# Patient Record
Sex: Male | Born: 1955
Health system: Southern US, Community
[De-identification: ages and names within clinical notes are randomized; demographics above are authoritative.]

## PROBLEM LIST (undated history)

## (undated) DIAGNOSIS — J449 Chronic obstructive pulmonary disease, unspecified: Secondary | ICD-10-CM

## (undated) DIAGNOSIS — J45909 Unspecified asthma, uncomplicated: Secondary | ICD-10-CM

## (undated) DIAGNOSIS — M109 Gout, unspecified: Secondary | ICD-10-CM

## (undated) HISTORY — DX: Gout, unspecified: M10.9

## (undated) HISTORY — DX: Unspecified asthma, uncomplicated: J45.909

## (undated) HISTORY — DX: Chronic obstructive pulmonary disease, unspecified: J44.9

---

## 2010-12-05 ENCOUNTER — Encounter (HOSPITAL_COMMUNITY)
Admission: RE | Admit: 2010-12-05 | Discharge: 2010-12-05 | Disposition: A | Discharge status: Home / Self Care | Payer: BC Managed Care – PPO | Source: Ambulatory Visit | Attending: Orthopaedic Surgery | Admitting: Orthopaedic Surgery

## 2010-12-05 ENCOUNTER — Ambulatory Visit (HOSPITAL_COMMUNITY)
Admission: RE | Admit: 2010-12-05 | Discharge: 2010-12-05 | Disposition: A | Discharge status: Home / Self Care | Payer: BC Managed Care – PPO | Source: Ambulatory Visit | Attending: Orthopaedic Surgery | Admitting: Orthopaedic Surgery

## 2010-12-05 ENCOUNTER — Other Ambulatory Visit (HOSPITAL_COMMUNITY): Payer: Self-pay | Admitting: Orthopaedic Surgery

## 2010-12-05 DIAGNOSIS — M1711 Unilateral primary osteoarthritis, right knee: Secondary | ICD-10-CM

## 2010-12-05 DIAGNOSIS — M1710 Unilateral primary osteoarthritis, unspecified knee: Secondary | ICD-10-CM | POA: Insufficient documentation

## 2010-12-05 DIAGNOSIS — Z01812 Encounter for preprocedural laboratory examination: Secondary | ICD-10-CM | POA: Insufficient documentation

## 2010-12-05 DIAGNOSIS — J438 Other emphysema: Secondary | ICD-10-CM | POA: Insufficient documentation

## 2010-12-05 DIAGNOSIS — IMO0002 Reserved for concepts with insufficient information to code with codable children: Secondary | ICD-10-CM | POA: Insufficient documentation

## 2010-12-05 DIAGNOSIS — Z01810 Encounter for preprocedural cardiovascular examination: Secondary | ICD-10-CM | POA: Insufficient documentation

## 2010-12-05 DIAGNOSIS — Z01811 Encounter for preprocedural respiratory examination: Secondary | ICD-10-CM | POA: Insufficient documentation

## 2010-12-05 LAB — BASIC METABOLIC PANEL
BUN: 16 mg/dL (ref 6–23)
GFR calc Af Amer: >60 mL/min (ref >60)
GFR calc non Af Amer: >60 mL/min (ref >60)
Potassium: 5.6 mEq/L — ABNORMAL HIGH (ref 3.5–5.1)

## 2010-12-05 LAB — CBC
HCT: 42.7 % (ref 39.0–52.0)
MCHC: 34.9 g/dL (ref 30.0–36.0)
RDW: 13 % (ref 11.5–15.5)

## 2010-12-05 LAB — TYPE AND SCREEN: ABO/RH(D): O NEG

## 2010-12-05 LAB — ABO/RH: ABO/RH(D): O NEG

## 2010-12-11 ENCOUNTER — Inpatient Hospital Stay (HOSPITAL_COMMUNITY)
Admission: RE | Admit: 2010-12-11 | Discharge: 2010-12-14 | DRG: 209 | Disposition: A | Discharge status: Home Health | Payer: BC Managed Care – PPO | Source: Ambulatory Visit | Attending: Orthopaedic Surgery | Admitting: Orthopaedic Surgery

## 2010-12-11 DIAGNOSIS — I10 Essential (primary) hypertension: Secondary | ICD-10-CM | POA: Diagnosis present

## 2010-12-11 DIAGNOSIS — J4489 Other specified chronic obstructive pulmonary disease: Secondary | ICD-10-CM | POA: Diagnosis present

## 2010-12-11 DIAGNOSIS — J449 Chronic obstructive pulmonary disease, unspecified: Secondary | ICD-10-CM | POA: Diagnosis present

## 2010-12-11 DIAGNOSIS — M1710 Unilateral primary osteoarthritis, unspecified knee: Principal | ICD-10-CM | POA: Diagnosis present

## 2010-12-11 DIAGNOSIS — F17200 Nicotine dependence, unspecified, uncomplicated: Secondary | ICD-10-CM | POA: Diagnosis present

## 2010-12-11 HISTORY — PX: REPLACEMENT TOTAL KNEE: SUR1224

## 2010-12-12 LAB — CBC
MCH: 31.5 pg (ref 26.0–34.0)
MCHC: 34.4 g/dL (ref 30.0–36.0)
MCV: 91.5 fL (ref 78.0–100.0)
Platelets: 192 10*3/uL (ref 150–400)
RDW: 13.4 % (ref 11.5–15.5)
WBC: 9.3 10*3/uL (ref 4.0–10.5)

## 2010-12-12 LAB — BASIC METABOLIC PANEL
BUN: 12 mg/dL (ref 6–23)
Calcium: 8.3 mg/dL — ABNORMAL LOW (ref 8.4–10.5)
Creatinine, Ser: 0.82 mg/dL (ref 0.50–1.35)
GFR calc non Af Amer: >60 mL/min (ref >60)
Glucose, Bld: 115 mg/dL — ABNORMAL HIGH (ref 70–99)

## 2010-12-13 LAB — CBC
MCH: 31.6 pg (ref 26.0–34.0)
MCHC: 34.5 g/dL (ref 30.0–36.0)
MCV: 91.6 fL (ref 78.0–100.0)
Platelets: 175 10*3/uL (ref 150–400)

## 2010-12-13 LAB — PROTIME-INR: Prothrombin Time: 17.5 seconds — ABNORMAL HIGH (ref 11.6–15.2)

## 2010-12-13 LAB — BASIC METABOLIC PANEL
BUN: 10 mg/dL (ref 6–23)
Creatinine, Ser: 0.8 mg/dL (ref 0.50–1.35)
GFR calc Af Amer: >60 mL/min (ref >60)
GFR calc non Af Amer: >60 mL/min (ref >60)
Glucose, Bld: 103 mg/dL — ABNORMAL HIGH (ref 70–99)

## 2010-12-14 LAB — CBC
HCT: 27.3 % — ABNORMAL LOW (ref 39.0–52.0)
MCH: 31.4 pg (ref 26.0–34.0)
MCHC: 34.8 g/dL (ref 30.0–36.0)
RDW: 13.3 % (ref 11.5–15.5)

## 2010-12-14 LAB — BASIC METABOLIC PANEL
BUN: 7 mg/dL (ref 6–23)
CO2: 30 mEq/L (ref 19–32)
Calcium: 8.8 mg/dL (ref 8.4–10.5)
Creatinine, Ser: 0.83 mg/dL (ref 0.50–1.35)

## 2010-12-14 LAB — PROTIME-INR
INR: 1.91 — ABNORMAL HIGH (ref 0.00–1.49)
Prothrombin Time: 22.2 seconds — ABNORMAL HIGH (ref 11.6–15.2)

## 2011-01-02 NOTE — Op Note (Signed)
Anthony Mayo, [name]                ACCOUNT NO.:  [id number]  MEDICAL RECORD NO.:  [id number]  LOCATION:  5024                         FACILITY:  [facility]  PHYSICIAN:  Lubertha Basque. Savi Lastinger, M.D.DATE OF BIRTH:  [birth year]  DATE OF PROCEDURE:  12/11/2010 DATE OF DISCHARGE:                              OPERATIVE REPORT   PREOPERATIVE DIAGNOSIS:  Right knee degenerative joint disease.  POSTOPERATIVE DIAGNOSIS:  Right knee degenerative joint disease.  PROCEDURE:  Right total knee replacement.  ANESTHESIA:  General and block.  ATTENDING SURGEON:  Lubertha Basque. Jerl Santos, MD  ASSISTANT:  Lindwood Qua, PA   INDICATIONS FOR PROCEDURE:  The patient is a 55 year old male with a long history of right knee issues.  He has had 30 years of pain with a history of arthroscopy and injections and pills.  By x-ray he has advanced degenerative change and large osteophytes and loose bodies.  He has pain, which limits his ability to rest and walk and exercise, and he is offered a knee replacement.  Informed operative consent was obtained after discussion of possible complications including reaction to anesthesia, infection, DVT, PE, and death.  Importance of the postoperative rehabilitation protocol to optimize result was stressed extensively with the patient.  SUMMARY FINDINGS AND PROCEDURE:  Under general anesthesia and block, a right knee replacement was performed.  He had advanced degenerative change and loose bodies.  He had good bone quality.  We addressed his problem with a cemented DePuy system using a 5 tibia, large femur, 10 deep dish spacer, and 41 all polyethylene patella.  I did include Zinacef antibiotic in the cement.  Lindwood Qua assisted throughout and was invaluable to the completion of the case in that he helped position and retract while I performed the procedure.  He also closed simultaneously to help minimize OR time.  He was admitted for appropriate postop  care.  DESCRIPTION OF THE PROCEDURE:  The patient was brought to the operating suite where general anesthetic was applied without difficulty.  He was also given a block in the preanesthesia area.  He was positioned supine and prepped and draped in normal sterile fashion.  After administration of IV Kefzol, the right leg was elevated, exsanguinated, and a tourniquet inflated about the thigh.  A longitudinal anterior incision was made with dissection down the extensor mechanism.  All appropriate anti-infective measures were used including closed hooded exhaust systems for each member of the surgical team, Betadine-impregnated drape, and the preoperative IV antibiotic.  A medial parapatellar incision was made.  The kneecap was flipped and knee flexed.  Some residual meniscal tissues were removed along with the ACL and PCL and fat pad.  There really was not much of an ACL but he did have a PCL remnant which I removed.  We removed a large osteophytes and extremely large loose body from the suprapatellar pouch which measured about 4 x 3 x 1 cm in size.  I then used an extramedullary guide on the tibia to make a roughly flat cut.  We then placed an intramedullary guide in the femur to make anterior-posterior cuts, creating a flexion gap of 10 mm. A second intramedullary guide was placed in the  femur to make a distal cut, creating an equal extension gap of 10 mm balancing the knee.  This did require one re-cut to balance appropriately.  The femur sized to a large and the tibia to a 5 and appropriate guides were placed and utilized.  Patella was cut down thickness by about 14 mm to 15 mm in diameter and was then sized to a 41 with appropriate guide placed and utilized.  A trial reduction was done with all these components and the 10 spacer.  He easily came to full extension.  The patella tracked well and flexion.  Trial components were removed followed by pulsatile lavage irrigation of all 3 cut  bony surfaces.  Cement was mixed including Zinacef antibiotic and was pressurized onto the bones.  This was followed by placement of the aforementioned DePuy LCS components. Pressure was held on the components until the cement had hardened and an excess cement was trimmed.  The tourniquet was deflated and a small amount of bleeding was easily controlled with Bovie cautery.  Again, the knee ranged fully.  We irrigated again and placed a drain, exiting superolaterally.  The extensor mechanism was then reapproximated with #1 Vicryl in interrupted fashion.  Subcutaneous tissues were reapproximated with 0 and 2-0 undyed Vicryl and skin was closed with staples.  Adaptic was applied followed by dry gauze and loose Ace wrap.  Estimated blood loss and fluids obtained from anesthesia records as can accurate tourniquet time.  DISPOSITION:  The patient was extubated in the operating room and taken to recovery room in stable addition.  He was to be admitted to the Orthopedic Surgery Service for appropriate postop care to include perioperative antibiotics and Coumadin plus Lovenox for DVT prophylaxis.     Lubertha Basque Jerl Santos, M.D.     PGD/MEDQ  D:  12/11/2010  T:  12/11/2010  Job:  [id number]  Electronically Signed by Marcene Corning M.D. on 01/02/2011 08:40:05 PM

## 2011-01-02 NOTE — Discharge Summary (Signed)
Anthony Mayo, Anthony Mayo                ACCOUNT NO.:  [id number]  MEDICAL RECORD NO.:  [id number]  LOCATION:  5024                         FACILITY:  [facility]  PHYSICIAN:  Lubertha Basque. Sherissa Tenenbaum, M.D.DATE OF BIRTH:  [birth year]  DATE OF ADMISSION:  12/11/2010 DATE OF DISCHARGE:  12/14/2010                              DISCHARGE SUMMARY   ADMITTING DIAGNOSES: 1. Right knee end-stage degenerative joint disease. 2. Hypertension. 3. Tobacco abuse. 4. History of gout.  DISCHARGE DIAGNOSES: 1. Right knee end-stage degenerative joint disease. 2. Hypertension. 3. Tobacco abuse. 4. History of gout.  OPERATION:  Right total knee replacement.  BRIEF HISTORY:  Anthony Mayo is a 55 year old white male who is complaining for the last number of months of increased right knee pain. He is having pain with every step, trouble sleeping at nighttime, trouble being comfortable walking, pain and swelling in the right knee, unrelieved by oral anti-inflammatory medicines or corticosteroid injections, he denies.  X-rays revealed bone-on-bone DJD.  We have discussed treatment options with him that being total knee replacement.  PERTINENT LABORATORY AND X-RAY FINDINGS:  CBC last testing available at the time of dictation, hemoglobin 10.1, hematocrit 29.3, and WBCs 10.9. Chem-7:  Sodium 141, potassium 3.9, BUN 10, creatinine 0.80, and glucose 103.  INR 1.41.  HOSPITAL COURSE:  He was admitted postoperatively, provided p.o. and IV analgesics for pain.  We used a Dilaudid PCA pump as well as p.o. Percocet.  He was given perioperative antibiotics, Lovenox and Coumadin per DVT prophylaxis regulated by pharmacy, duration of Coumadin would be 2 weeks, knee-high TED, incentive spirometry, physical therapy for weightbearing as tolerated and then a CPM machine 4-6 hours a day advancing as tolerated.  Knee immobilizer p.r.n. The first day postop, his vital signs were 148/81 blood pressure, heart rate of 110,  and temperature 99.  Lungs were clear.  Abdomen was soft.  Foley catheter had been discontinued and he was voiding well.  Right knee exam, no significant drainage.  There was a drain and that will be left in until second day postop.  Calf, soft and nontender.  Normal neurovascular status.  On second day postop, his vital signs remained stable.  His drain was pulled.  His dressing was changed.  Wound was noted to be benign.  Lungs were clear.  Abdomen was soft as well.  We had planned for home physical therapy due to the cause of probably not using a CPM machine and then home blood draws for INRs with a target INR between 2.0 and 3.0 per total of 14 days.  He continued to progress well and was discharged home on the third day.  CONDITION ON DISCHARGE:  Improved.  FOLLOWUP:  He will remain on a low-sodium heart-healthy diet.  He may be weightbearing as tolerated.  He may change his dressing daily.  He had arrangements for home therapy and home INRs to be done.  Any sign of infection which would be increasing redness, drainage, increasing pain to call our office at [phone].  Also that same number to set up an appointment for 2 weeks postoperatively.  He is given 3 prescriptions above and beyond his usual medicines that can be found  in the med discharge management sheet.  They are Coumadin 5 mg tablet to be regulated by the pharmacist for a total of 14 days, Robaxin 500 one p.o. q.8 p.r.n. spasm, and then Percocet 5/325 for pain as needed one to two q.4-6.  Change his dressing daily, ice, and elevation.     Anthony Mayo, P.A.   ______________________________ Lubertha Basque. Jerl Santos, M.D.    MC/MEDQ  D:  12/13/2010  T:  12/14/2010  Job:  [id number]  Electronically Signed by Anthony Mayo P.A. on 12/18/2010 02:26:58 PM Electronically Signed by Marcene Corning M.D. on 01/02/2011 08:38:07 PM

## 2011-01-02 NOTE — H&P (Addendum)
Anthony Mayo, Anthony Mayo                ACCOUNT NO.:  [id number]  MEDICAL RECORD NO.:  [id number]  LOCATION:  XRAY                         FACILITY:  [facility]  PHYSICIAN:  Lubertha Basque. Beatriz Settles, M.D.DATE OF BIRTH:  11/11/1955  DATE OF ADMISSION:  12/11/2010 DATE OF DISCHARGE:  12/14/2010                             HISTORY & PHYSICAL   CHIEF COMPLAINT:  Right knee pain.  HISTORY OF PRESENT ILLNESS:  Anthony Mayo is a 55 year old, does machine and body work mostly standing and has had pain with his right knee.  He had an arthroscopic operation some 30 years ago for torn meniscus and had done well initially but lately he has been having increasing discomfort, pain with standing, pain with trying to rest at nighttime. His x-rays reveal bone-on-bone DJD and has also recently been diagnosed with gout not just in the knee but at the elbow and other locations.  We discussed treatment options with him that being total knee replacement on the right side.  PAST MEDICAL HISTORY:  He is not a diabetic and has no drug allergies. A 14-point review of systems is performed and negative except for hypertension, corrective glasses, and asthma.  CURRENT MEDICATIONS:  Indomethacin and he has just finished the prednisone Dosepak a couple weeks ago.  He has a history of surgery, arthroscopy on his right knee back in 1980s, medical coverages through urgent medical care in [place].  FAMILY HISTORY:  Positive for diabetes and negative for arthritis.  SOCIAL HISTORY:  He is married.  He works as a Chartered certified accountant.  Smokes a pack and a half of cigarettes a day, drinks alcohol on most days.  PHYSICAL EXAMINATION:  VITAL SIGNS:  Stable.  Blood pressure within normal limits.  Temperature is 98, respirations 18. GENERAL:  Oriented appropriately speech and behavior, in no apparent acute distress, walks with a slight antalgic gait. HEENT:  Eyes:  PERRLA.  Visual fields are normal.  No oropharynx obstruction. MUSCULOSKELETAL:   Cervical motion full.  No bruits noted.  No adenopathy in the cervical spine area, upper extremity.  Reflexes are equal and active motion of most upper extremity joints full and pain free. LUNGS: Clear to A and P. CARDIAC:  Regular rate and rhythm. ABDOMEN:  Positive bowel sounds.  Negative guarding.  No spleen or liver enlargement. EXTREMITIES:  Lower extremity examination, skin is intact bilaterally with good pulses felt bilaterally as well.  Right hip and ankle full motion.  Left hip, knee, and ankle full motion.  The right knee motion is near 0 extension, 110 degrees of flexion.  There is no significant effusion.  He does have marked crepitation in medial joint line. Negative straight leg raising.  Patellar tendon, quad tendon functioning normally.  He does have well-healed arthroscopic portals on the anterior aspect of his knee.  Once again good pulses in both lower extremities.  X-RAY FINDINGS:  Bone-on-bone DJD, medial and patellofemoral compartment right knee.  ASSESSMENT: 1. Right knee end-stage degenerative joint disease. 2. Hypertension.  PLAN:  Proceed with a total knee replacement on the right side and then admit the patient to the hospital postoperatively for pain control and physical therapy.     Lindwood Qua,  P.A.   ______________________________ Lubertha Basque. Jerl Santos, M.D.    MC/MEDQ  D:  12/10/2010  T:  12/10/2010  Job:  [id number]  Electronically Signed by Lindwood Qua P.A. on 12/18/2010 09:81:19 PM Electronically Signed by Marcene Corning M.D. on 01/02/2011 08:40:18 PM

## 2011-05-31 ENCOUNTER — Ambulatory Visit (INDEPENDENT_AMBULATORY_CARE_PROVIDER_SITE_OTHER): Payer: BC Managed Care – PPO

## 2011-05-31 DIAGNOSIS — R5381 Other malaise: Secondary | ICD-10-CM

## 2011-05-31 DIAGNOSIS — R059 Cough, unspecified: Secondary | ICD-10-CM

## 2011-05-31 DIAGNOSIS — R5383 Other fatigue: Secondary | ICD-10-CM

## 2011-05-31 DIAGNOSIS — J449 Chronic obstructive pulmonary disease, unspecified: Secondary | ICD-10-CM

## 2011-05-31 DIAGNOSIS — R05 Cough: Secondary | ICD-10-CM

## 2012-03-19 ENCOUNTER — Ambulatory Visit (INDEPENDENT_AMBULATORY_CARE_PROVIDER_SITE_OTHER): Payer: BC Managed Care – PPO | Admitting: Internal Medicine

## 2012-03-19 VITALS — BP 133/100 | HR 81 | Temp 98.1°F | Resp 18 | Ht 73.0 in | Wt 185.0 lb

## 2012-03-19 DIAGNOSIS — R509 Fever, unspecified: Secondary | ICD-10-CM

## 2012-03-19 DIAGNOSIS — J40 Bronchitis, not specified as acute or chronic: Secondary | ICD-10-CM

## 2012-03-19 DIAGNOSIS — R05 Cough: Secondary | ICD-10-CM

## 2012-03-19 DIAGNOSIS — J45909 Unspecified asthma, uncomplicated: Secondary | ICD-10-CM

## 2012-03-19 DIAGNOSIS — R0602 Shortness of breath: Secondary | ICD-10-CM

## 2012-03-19 DIAGNOSIS — R059 Cough, unspecified: Secondary | ICD-10-CM

## 2012-03-19 MED ORDER — METHYLPREDNISOLONE ACETATE 80 MG/ML IJ SUSP
80.0000 mg | Freq: Once | INTRAMUSCULAR | Status: AC
  Administered 2012-03-19: 80 mg via INTRAMUSCULAR

## 2012-03-19 MED ORDER — AZITHROMYCIN 500 MG PO TABS: 500.0000 mg | ORAL_TABLET | Freq: Every day | ORAL | Status: DC

## 2012-03-19 MED ORDER — MOMETASONE FURO-FORMOTEROL FUM 100-5 MCG/ACT IN AERO: 2.0000 | INHALATION_SPRAY | Freq: Two times a day (BID) | RESPIRATORY_TRACT | Status: DC

## 2012-03-19 MED ORDER — ALBUTEROL SULFATE (2.5 MG/3ML) 0.083% IN NEBU
2.5000 mg | INHALATION_SOLUTION | Freq: Once | RESPIRATORY_TRACT | Status: AC
  Administered 2012-03-19: 2.5 mg via RESPIRATORY_TRACT

## 2012-03-19 MED ORDER — ALBUTEROL SULFATE HFA 108 (90 BASE) MCG/ACT IN AERS: 2.0000 | INHALATION_SPRAY | Freq: Four times a day (QID) | RESPIRATORY_TRACT | Status: DC | PRN

## 2012-03-19 MED ORDER — HYDROCODONE-ACETAMINOPHEN 7.5-500 MG/15ML PO SOLN: 5.0000 mL | Freq: Four times a day (QID) | ORAL | Status: DC | PRN

## 2012-03-19 NOTE — Patient Instructions (Signed)
Chronic Asthmatic Bronchitis Chronic asthmatic bronchitis is often a complication of frequent asthma and/or bronchitis. After a long enough period of time, the continual airflow blockage is present in spite of treatment for asthma. The medications that used to treat asthma no longer work. The symptoms of chronic bronchitis may also be present. Bronchitis is an inflammation of the breathing tubules in the lungs. The combination of asthma, chronic bronchitis, and emphysema all affect the small breathing tubules (bronchial tree) in our lungs. It is a common condition. The problems from each are similar and overlap with each other so are sometimes hard to diagnose. When the asthma and bronchitis are combined, there is usually inflammation and infection. The small bronchial tubes produce more mucus. This blocks the airways and makes breathing harder. Usually this process is caused more by external irritants than infection. Smokers with chronic bronchitis are at a greater risk to develop asthmatic bronchitis. CAUSES   Why some people with asthma go on to develop chronic asthmatic bronchitis is not known. Smoking and environmental toxins or allergens seem to play a role. There are wide differences in who is susceptible.  Abnormalities of the small airways may develop in persons with persistent asthma. Asthmatics can be uncommonly subject to the effects of smoking. Asthma is also found associated with a number of other diseases. SYMPTOMS  Asthma, chronic bronchitis, and emphysema all cause symptoms of cough, wheezing, shortness of breath, and recurring infections. There may also be chest discomfort. All of the above symptoms happen more often in chronic asthmatic bronchitis. DIAGNOSIS   Asthma, chronic bronchitis, and emphysema all affect the entire bronchial tree. This makes it difficult on exam to tell them apart. Other tests of the lungs are done to prove a diagnosis. These are called pulmonary function  tests. TREATMENT   The asthmatic condition itself must always be treated.  Infection can be treated with antibiotics (medications to kill germs).  Serious infections may require hospitalization. These can include pneumonia, sinus infections, and acute bronchitis. HOME CARE INSTRUCTIONS  Use prescription medications as ordered by your caregiver.  Avoid pollen, dust, animal dander, molds, smoke, and other things that cause attacks at home and at work.  You may have fewer attacks if you decrease dust in your home. Electrostatic air cleaners may help.  It may help to replace your pillows or mattress with materials less likely to cause allergies.  If you are not on fluid restriction, drink 8 to 10 glasses of water each day.  Discuss possible exercise routines with your caregiver.  If animal dander is the cause of asthma, you may need to get rid of pets.  It is important that you:  Become educated about your medical condition.  Participate in maintaining wellness.  Seek medical care promptly or immediately as indicated below.  Delay in seeking medical attention could cause permanent injury and may be a risk to your life. SEEK MEDICAL CARE IF  You have wheezing and shortness of breath even if taking medicine to prevent attacks.  An oral temperature above 102 F (38.9 C)  You have muscle aches, chest pain, or thickening of sputum.  Your sputum changes from clear or white to yellow, green, gray, or bloody.  You have any problems that may be related to the medicine you are taking (such as a rash, itching, swelling, or trouble breathing). SEEK IMMEDIATE MEDICAL CARE IF:  Your usual medicines do not stop your wheezing.  There is increased coughing and/or shortness of breath.  You   have increased difficulty breathing. MAKE SURE YOU:   Understand these instructions.  Will watch your condition.  Will get help right away if you are not doing well or get worse. Document  Released: 02/21/2006 Document Revised: 07/29/2011 Document Reviewed: 04/21/2007 Exit[employer] Patient Information 2013 [employer].  

## 2012-03-19 NOTE — Progress Notes (Signed)
  Subjective:    Patient ID: Anthony Mayo, male    DOB: February 26, 1956, 56 y.o.   MRN: [id number]  HPI Hx of asthma Now sick with cough, green sputum, chills and chest congestion No cpe in yrs   Review of Systems     Objective:   Physical Exam  Constitutional: He appears well-developed and well-nourished. No distress.  HENT:  Right Ear: External ear normal.  Left Ear: External ear normal.  Nose: Nose normal.  Mouth/Throat: Oropharynx is clear and moist.  Eyes: EOM are normal.  Neck: Normal range of motion. Neck supple.  Cardiovascular: Normal rate, regular rhythm and normal heart sounds.   Pulmonary/Chest: No accessory muscle usage. Not tachypneic. No respiratory distress. He has decreased breath sounds. He has wheezes. He has rhonchi. He has no rales. He exhibits no tenderness.     PFR 320 NEB Post PFR 350     Assessment & Plan:  Chronic asthmatic bronchitis Needs evaluation for copd r/o RF meds 1 yr CPE with me next week

## 2012-03-24 ENCOUNTER — Ambulatory Visit (INDEPENDENT_AMBULATORY_CARE_PROVIDER_SITE_OTHER): Payer: BC Managed Care – PPO | Admitting: Internal Medicine

## 2012-03-24 ENCOUNTER — Ambulatory Visit: Payer: BC Managed Care – PPO

## 2012-03-24 VITALS — BP 144/86 | HR 62 | Temp 97.7°F | Resp 16 | Ht 72.0 in | Wt 188.0 lb

## 2012-03-24 DIAGNOSIS — Z79899 Other long term (current) drug therapy: Secondary | ICD-10-CM

## 2012-03-24 DIAGNOSIS — Z800 Family history of malignant neoplasm of digestive organs: Secondary | ICD-10-CM

## 2012-03-24 DIAGNOSIS — Z0000 Encounter for general adult medical examination without abnormal findings: Secondary | ICD-10-CM

## 2012-03-24 DIAGNOSIS — J4489 Other specified chronic obstructive pulmonary disease: Secondary | ICD-10-CM

## 2012-03-24 DIAGNOSIS — J449 Chronic obstructive pulmonary disease, unspecified: Secondary | ICD-10-CM

## 2012-03-24 DIAGNOSIS — Z23 Encounter for immunization: Secondary | ICD-10-CM

## 2012-03-24 LAB — POCT UA - MICROSCOPIC ONLY
Bacteria, U Microscopic: NEGATIVE
RBC, urine, microscopic: NEGATIVE

## 2012-03-24 LAB — POCT URINALYSIS DIPSTICK
Bilirubin, UA: NEGATIVE
Glucose, UA: NEGATIVE
Ketones, UA: NEGATIVE
Leukocytes, UA: NEGATIVE
Protein, UA: NEGATIVE
Spec Grav, UA: 1.025

## 2012-03-24 LAB — POCT CBC
Granulocyte percent: 72.2 %G (ref 37–80)
Hemoglobin: 15.2 g/dL (ref 14.1–18.1)
MCH, POC: 31.1 pg (ref 27–31.2)
MID (cbc): 0.6 (ref 0–0.9)
MPV: 8.3 fL (ref 0–99.8)
POC MID %: 6.1 %M (ref 0–12)
Platelet Count, POC: 327 10*3/uL (ref 142–424)
RBC: 4.89 M/uL (ref 4.69–6.13)
WBC: 10.6 10*3/uL — AB (ref 4.6–10.2)

## 2012-03-24 LAB — IFOBT (OCCULT BLOOD): IFOBT: NEGATIVE

## 2012-03-24 MED ORDER — TIOTROPIUM BROMIDE MONOHYDRATE 18 MCG IN CAPS: 18.0000 ug | ORAL_CAPSULE | Freq: Every day | RESPIRATORY_TRACT | Status: DC

## 2012-03-24 MED ORDER — PREDNISONE 10 MG PO TABS: ORAL_TABLET | ORAL | Status: DC

## 2012-03-24 NOTE — Patient Instructions (Addendum)

## 2012-03-24 NOTE — Progress Notes (Signed)
  Subjective:    Patient ID: Anthony Mayo, male    DOB: 1956/05/15, 55 y.o.   MRN: [id number]  HPI Feeling much better with asthma and copd. Former smoker. Needs cpe today and agrees. See hx scanned Fhx father colon cancer, he must have colonoscopy soon  Review of Systems  Constitutional: Negative.   HENT: Negative.   Eyes: Negative.   Respiratory: Positive for wheezing.   Cardiovascular: Negative.   Gastrointestinal: Negative.   Musculoskeletal: Negative.   Neurological: Negative.   Hematological: Negative.   Psychiatric/Behavioral: Negative.    See ros scanned    Objective:   Physical Exam  Constitutional: He is oriented to person, place, and time. He appears well-developed and well-nourished.  HENT:  Right Ear: External ear normal.  Left Ear: External ear normal.  Nose: Nose normal.  Mouth/Throat: Oropharynx is clear and moist.  Eyes: EOM are normal. Pupils are equal, round, and reactive to light.  Neck: Normal range of motion. Neck supple. No tracheal deviation present. No thyromegaly present.  Cardiovascular: Normal rate, regular rhythm, normal heart sounds and intact distal pulses.   Pulmonary/Chest: Tachypnea noted. He has decreased breath sounds. He has wheezes. He has rhonchi.  Abdominal: Soft. Bowel sounds are normal. He exhibits no distension and no mass. There is no tenderness.  Genitourinary: Rectum normal, prostate normal and penis normal.  Musculoskeletal: Normal range of motion. He exhibits no tenderness.  Lymphadenopathy:    He has no cervical adenopathy.  Neurological: He is alert and oriented to person, place, and time. He has normal reflexes. No cranial nerve deficit. He exhibits normal muscle tone. Coordination normal.  Skin: Skin is warm and dry.  Psychiatric: He has a normal mood and affect. His behavior is normal.    PFR 380, nl 550  [facility] reading (PRIMARY) by  Dr Perrin Maltese hyperinflation consistent with copd      Assessment & Plan:  Restart  Spiriva once daily Prednisone 10mg  6day taper Do PFTS 3 months

## 2012-03-25 ENCOUNTER — Encounter: Payer: Self-pay | Admitting: Internal Medicine

## 2012-03-25 LAB — COMPREHENSIVE METABOLIC PANEL
ALT: 15 U/L (ref 0–53)
Albumin: 4.1 g/dL (ref 3.5–5.2)
CO2: 28 mEq/L (ref 19–32)
Glucose, Bld: 93 mg/dL (ref 70–99)
Potassium: 4.7 mEq/L (ref 3.5–5.3)
Sodium: 140 mEq/L (ref 135–145)
Total Bilirubin: 0.3 mg/dL (ref 0.3–1.2)
Total Protein: 7.3 g/dL (ref 6.0–8.3)

## 2012-03-25 LAB — TSH: TSH: 1.708 u[IU]/mL (ref 0.350–4.500)

## 2012-03-25 LAB — PSA: PSA: 0.91 ng/mL (ref <=4.00)

## 2012-04-23 ENCOUNTER — Ambulatory Visit (AMBULATORY_SURGERY_CENTER): Payer: BC Managed Care – PPO | Admitting: *Deleted

## 2012-04-23 VITALS — Ht 72.0 in | Wt 185.0 lb

## 2012-04-23 DIAGNOSIS — Z1211 Encounter for screening for malignant neoplasm of colon: Secondary | ICD-10-CM

## 2012-04-23 MED ORDER — SUPREP BOWEL PREP KIT 17.5-3.13-1.6 GM/177ML PO SOLN: ORAL | Status: DC

## 2012-04-30 ENCOUNTER — Other Ambulatory Visit: Payer: Self-pay | Admitting: Emergency Medicine

## 2012-05-05 ENCOUNTER — Ambulatory Visit (AMBULATORY_SURGERY_CENTER): Payer: BC Managed Care – PPO | Admitting: Internal Medicine

## 2012-05-05 ENCOUNTER — Encounter: Payer: Self-pay | Admitting: Internal Medicine

## 2012-05-05 VITALS — BP 144/81 | HR 82 | Temp 98.3°F | Resp 14 | Ht 72.0 in | Wt 185.0 lb

## 2012-05-05 DIAGNOSIS — K5730 Diverticulosis of large intestine without perforation or abscess without bleeding: Secondary | ICD-10-CM

## 2012-05-05 DIAGNOSIS — Z1211 Encounter for screening for malignant neoplasm of colon: Secondary | ICD-10-CM

## 2012-05-05 MED ORDER — SODIUM CHLORIDE 0.9 % IV SOLN: 500.0000 mL | INTRAVENOUS | Status: DC

## 2012-05-05 NOTE — Progress Notes (Signed)
No complaints noted in the recovery room. Maw   

## 2012-05-05 NOTE — Progress Notes (Signed)
Propofol given over incremental dosages 

## 2012-05-05 NOTE — Progress Notes (Signed)
Patient did not experience any of the following events: a burn prior to discharge; a fall within the facility; wrong site/side/patient/procedure/implant event; or a hospital transfer or hospital admission upon discharge from the facility. (G8907) Patient did not have preoperative order for IV antibiotic SSI prophylaxis. (G8918)  

## 2012-05-05 NOTE — Op Note (Signed)
[facility] [address] [place], [address]   COLONOSCOPY PROCEDURE REPORT  PATIENT: Anthony, Mayo.  MR#: [id number] BIRTHDATE: 03/13/1956 , 56  yrs. old GENDER: Male ENDOSCOPIST: Iva Boop, MD, [facility] REFERRED GN:FAOZH Guest, M.D. PROCEDURE DATE:  05/05/2012 PROCEDURE:   Colonoscopy, diagnostic ASA CLASS:   Class II INDICATIONS:average risk screening. MEDICATIONS: MAC sedation, administered by CRNA, These medications were titrated to patient response per physician's verbal order, and propofol (Diprivan) 250mg  IV  DESCRIPTION OF PROCEDURE:   After the risks benefits and alternatives of the procedure were thoroughly explained, informed consent was obtained.  A digital rectal exam revealed no abnormalities of the rectum and A digital rectal exam revealed the prostate was not enlarged.   The LB [id number]  endoscope was introduced through the anus and advanced to the cecum, which was identified by both the appendix and ileocecal valve. No adverse events experienced.   The quality of the prep was Suprep good  The instrument was then slowly withdrawn as the colon was fully examined.      COLON FINDINGS: Moderate diverticulosis was noted in the sigmoid colon.   The colon mucosa was otherwise normal.   A right colon retroflexion was performed.  Retroflexed views revealed no abnormalities. The time to cecum=2 minutes 45 seconds.  Withdrawal time=8 minutes 55 seconds.  The scope was withdrawn and the procedure completed. COMPLICATIONS: There were no complications.  ENDOSCOPIC IMPRESSION: 1.   Moderate diverticulosis was noted in the sigmoid colon 2.   The colon mucosa was otherwise normal  RECOMMENDATIONS: Repeat Colonscopy in 10 years.   eSigned:  Iva Boop, MD, [facility] 05/05/2012 1:52 PM   cc: Robert Bellow, MD and The Patient

## 2012-05-05 NOTE — Patient Instructions (Addendum)
No polyps or cancer were seen. Next routine colonoscopy in 10 years!  You do have diverticulosis - thickened muscle rings and pouches in the colon wall. Please read the handout about this condition.  Thank you for choosing me and [facility].  Iva Boop, MD, [facility]   Handouts were given to your care partner on diverticulosis and high fiber diet.  You may resume your current medications today.  Please call if any questions or concerns.   YOU HAD AN ENDOSCOPIC PROCEDURE TODAY AT THE [facility]: Refer to the procedure report that was given to you for any specific questions about what was found during the examination.  If the procedure report does not answer your questions, please call your gastroenterologist to clarify.  If you requested that your care partner not be given the details of your procedure findings, then the procedure report has been included in a sealed envelope for you to review at your convenience later.  YOU SHOULD EXPECT: Some feelings of bloating in the abdomen. Passage of more gas than usual.  Walking can help get rid of the air that was put into your GI tract during the procedure and reduce the bloating. If you had a lower endoscopy (such as a colonoscopy or flexible sigmoidoscopy) you may notice spotting of blood in your stool or on the toilet paper. If you underwent a bowel prep for your procedure, then you may not have a normal bowel movement for a few days.  DIET: Your first meal following the procedure should be a light meal and then it is ok to progress to your normal diet.  A half-sandwich or bowl of soup is an example of a good first meal.  Heavy or fried foods are harder to digest and may make you feel nauseous or bloated.  Likewise meals heavy in dairy and vegetables can cause extra gas to form and this can also increase the bloating.  Drink plenty of fluids but you should avoid alcoholic beverages for 24 hours.  ACTIVITY: Your care partner  should take you home directly after the procedure.  You should plan to take it easy, moving slowly for the rest of the day.  You can resume normal activity the day after the procedure however you should NOT DRIVE or use heavy machinery for 24 hours (because of the sedation medicines used during the test).    SYMPTOMS TO REPORT IMMEDIATELY: A gastroenterologist can be reached at any hour.  During normal business hours, 8:30 AM to 5:00 PM Monday through Friday, call [phone].  After hours and on weekends, please call the GI answering service at [phone] who will take a message and have the physician on call contact you.   Following lower endoscopy (colonoscopy or flexible sigmoidoscopy):  Excessive amounts of blood in the stool  Significant tenderness or worsening of abdominal pains  Swelling of the abdomen that is new, acute  Fever of 100F or higher    FOLLOW UP: If any biopsies were taken you will be contacted by phone or by letter within the next 1-3 weeks.  Call your gastroenterologist if you have not heard about the biopsies in 3 weeks.  Our staff will call the home number listed on your records the next business day following your procedure to check on you and address any questions or concerns that you may have at that time regarding the information given to you following your procedure. This is a courtesy call and so if there  is no answer at the home number and we have not heard from you through the emergency physician on call, we will assume that you have returned to your regular daily activities without incident.  SIGNATURES/CONFIDENTIALITY: You and/or your care partner have signed paperwork which will be entered into your electronic medical record.  These signatures attest to the fact that that the information above on your After Visit Summary has been reviewed and is understood.  Full responsibility of the confidentiality of this discharge information lies with you and/or  your care-partner.

## 2012-05-06 ENCOUNTER — Telehealth: Payer: Self-pay

## 2012-05-06 NOTE — Telephone Encounter (Signed)
Left a message on the pt's answering machine to call if any questions or concerns [phone]. [place]

## 2012-06-19 ENCOUNTER — Telehealth: Payer: Self-pay

## 2012-06-19 NOTE — Telephone Encounter (Signed)
PT STATES HE HAVE BRONCHITIS AND A SORE THROAT. WOULD LIKE [place] TO CALL HIM SOMETHING IN FOR IT. HE HAD THE FLU SHOT, BUT FEELS LIKE HE MAY HAVE THE FLU OR SOMETHING. PLEASE CALL [phone]   [facility] ON [address]

## 2012-06-19 NOTE — Telephone Encounter (Signed)
We can not do this without office visit. I have advised patient of this.

## 2012-07-22 ENCOUNTER — Other Ambulatory Visit: Payer: Self-pay | Admitting: Family Medicine

## 2013-04-07 ENCOUNTER — Ambulatory Visit (INDEPENDENT_AMBULATORY_CARE_PROVIDER_SITE_OTHER): Payer: BC Managed Care – PPO | Admitting: Family Medicine

## 2013-04-07 ENCOUNTER — Ambulatory Visit: Payer: BC Managed Care – PPO

## 2013-04-07 VITALS — BP 172/98 | HR 99 | Temp 99.1°F | Resp 18 | Ht 71.5 in | Wt 182.2 lb

## 2013-04-07 DIAGNOSIS — R05 Cough: Secondary | ICD-10-CM

## 2013-04-07 DIAGNOSIS — R062 Wheezing: Secondary | ICD-10-CM

## 2013-04-07 DIAGNOSIS — R059 Cough, unspecified: Secondary | ICD-10-CM

## 2013-04-07 DIAGNOSIS — J449 Chronic obstructive pulmonary disease, unspecified: Secondary | ICD-10-CM

## 2013-04-07 DIAGNOSIS — J45909 Unspecified asthma, uncomplicated: Secondary | ICD-10-CM

## 2013-04-07 DIAGNOSIS — J40 Bronchitis, not specified as acute or chronic: Secondary | ICD-10-CM

## 2013-04-07 DIAGNOSIS — R0602 Shortness of breath: Secondary | ICD-10-CM

## 2013-04-07 LAB — POCT CBC
Granulocyte percent: 74.8 %G (ref 37–80)
HCT, POC: 47.5 % (ref 43.5–53.7)
Hemoglobin: 15 g/dL (ref 14.1–18.1)
Lymph, poc: 1.4 (ref 0.6–3.4)
MCH, POC: 30.9 pg (ref 27–31.2)
MCHC: 31.6 g/dL — AB (ref 31.8–35.4)
MCV: 98 fL — AB (ref 80–97)
MID (cbc): 0.6 (ref 0–0.9)
MPV: 8.4 fL (ref 0–99.8)
POC Granulocyte: 5.9 (ref 2–6.9)
POC LYMPH PERCENT: 18 %L (ref 10–50)
POC MID %: 7.2 %M (ref 0–12)
Platelet Count, POC: 241 10*3/uL (ref 142–424)
RBC: 4.85 M/uL (ref 4.69–6.13)
RDW, POC: 13.5 %
WBC: 7.9 10*3/uL (ref 4.6–10.2)

## 2013-04-07 MED ORDER — HYDROCODONE-HOMATROPINE 5-1.5 MG/5ML PO SYRP: 5.0000 mL | ORAL_SOLUTION | Freq: Three times a day (TID) | ORAL | Status: DC | PRN

## 2013-04-07 MED ORDER — MOMETASONE FURO-FORMOTEROL FUM 100-5 MCG/ACT IN AERO: 2.0000 | INHALATION_SPRAY | Freq: Two times a day (BID) | RESPIRATORY_TRACT | Status: DC

## 2013-04-07 MED ORDER — METHYLPREDNISOLONE (PAK) 4 MG PO TABS: ORAL_TABLET | ORAL | Status: DC

## 2013-04-07 MED ORDER — ALBUTEROL SULFATE HFA 108 (90 BASE) MCG/ACT IN AERS: 2.0000 | INHALATION_SPRAY | Freq: Four times a day (QID) | RESPIRATORY_TRACT | Status: DC | PRN

## 2013-04-07 MED ORDER — ALBUTEROL SULFATE (2.5 MG/3ML) 0.083% IN NEBU
2.5000 mg | INHALATION_SOLUTION | Freq: Once | RESPIRATORY_TRACT | Status: AC
  Administered 2013-04-07: 2.5 mg via RESPIRATORY_TRACT

## 2013-04-07 MED ORDER — DOXYCYCLINE HYCLATE 100 MG PO CAPS: 100.0000 mg | ORAL_CAPSULE | Freq: Two times a day (BID) | ORAL | Status: DC

## 2013-04-07 NOTE — Progress Notes (Signed)
  Subjective:    Patient ID: Anthony Mayo, male    DOB: September 12, 1955, 57 y.o.   MRN: [id number]  Cough Associated symptoms include postnasal drip, rhinorrhea, shortness of breath and wheezing. Pertinent negatives include no chills, ear pain, fever, headaches or sore throat.  Sore Throat  Associated symptoms include congestion, coughing and shortness of breath. Pertinent negatives include no abdominal pain, ear pain, headaches or vomiting.   57 year old male presents for evaluation of cough, SOB, wheezing, nasal congestion, and sinus pressure x 2 weeks.  States symptoms have progressively been getting worse. Does have hx of asthma/COPD and uses Dulera and ProAir regularly. Admits to increased use of ProAir with this illness. Had subjective fever and chills at onset but that has resolved.   He is a former smoker - quit about 1.5 yrs ago.  Denies hemoptysis, headache, sinus pain, sore throat, otalgia, or dizziness.   Patient is otherwise doing well with no other concerns today.     Review of Systems  Constitutional: Negative for fever and chills.  HENT: Positive for congestion, postnasal drip and rhinorrhea. Negative for ear pain, sinus pressure and sore throat.   Respiratory: Positive for cough, chest tightness, shortness of breath and wheezing.   Gastrointestinal: Negative for nausea, vomiting and abdominal pain.  Neurological: Negative for dizziness and headaches.       Objective:   Physical Exam  Constitutional: He is oriented to person, place, and time. He appears well-developed and well-nourished.  HENT:  Head: Normocephalic and atraumatic.  Right Ear: External ear normal.  Left Ear: External ear normal.  Nose: Right sinus exhibits no maxillary sinus tenderness and no frontal sinus tenderness. Left sinus exhibits no maxillary sinus tenderness and no frontal sinus tenderness.  Mouth/Throat: Uvula is midline, oropharynx is clear and moist and mucous membranes are normal.  Eyes:  Conjunctivae are normal.  Neck: Normal range of motion. Neck supple.  Cardiovascular: Normal rate, regular rhythm and normal heart sounds.   Pulmonary/Chest: Effort normal. He has wheezes (diffuse).  Lymphadenopathy:    He has no cervical adenopathy.  Neurological: He is alert and oriented to person, place, and time.  Psychiatric: He has a normal mood and affect. His behavior is normal. Judgment and thought content normal.      [facility] reading (PRIMARY) by  Dr. Alwyn Ren as small triangular opacity in right upper lung field. COPD without any acute infiltrate or consolidation.      Assessment & Plan:  Cough - Plan: DG Chest 2 View, POCT CBC, doxycycline (VIBRAMYCIN) 100 MG capsule, HYDROcodone-homatropine (HYCODAN) 5-1.5 MG/5ML syrup, methylPREDNIsolone (MEDROL DOSPACK) 4 MG tablet  Wheezing - Plan: methylPREDNIsolone (MEDROL DOSPACK) 4 MG tablet, albuterol (PROVENTIL) (2.5 MG/3ML) 0.083% nebulizer solution 2.5 mg  COPD (chronic obstructive pulmonary disease)  Asthma - Plan: mometasone-formoterol (DULERA) 100-5 MCG/ACT AERO, albuterol (PROVENTIL HFA;VENTOLIN HFA) 108 (90 BASE) MCG/ACT inhaler  SOB (shortness of breath) - Plan: mometasone-formoterol (DULERA) 100-5 MCG/ACT AERO, albuterol (PROVENTIL HFA;VENTOLIN HFA) 108 (90 BASE) MCG/ACT inhaler  Bronchitis - Plan: mometasone-formoterol (DULERA) 100-5 MCG/ACT AERO, albuterol (PROVENTIL HFA;VENTOLIN HFA) 108 (90 BASE) MCG/ACT inhaler  Will treat with Doxycycline 100 mg bid x 10 days Continue Dulera and ProAir as directed Hycodan qhs as needed for cough Medrol dose pack as directed Follow up if symptoms worsen or fail to improve

## 2013-04-07 NOTE — Progress Notes (Signed)
Review chest x-ray which was clear except for a little granuloma on the right lung which we will have the radiologist check on. It is triangular. Also discussed treatment plan.

## 2014-03-10 ENCOUNTER — Ambulatory Visit (INDEPENDENT_AMBULATORY_CARE_PROVIDER_SITE_OTHER): Payer: BC Managed Care – PPO | Admitting: Emergency Medicine

## 2014-03-10 ENCOUNTER — Ambulatory Visit (INDEPENDENT_AMBULATORY_CARE_PROVIDER_SITE_OTHER): Payer: BC Managed Care – PPO

## 2014-03-10 ENCOUNTER — Encounter: Payer: Self-pay | Admitting: Emergency Medicine

## 2014-03-10 DIAGNOSIS — J40 Bronchitis, not specified as acute or chronic: Secondary | ICD-10-CM

## 2014-03-10 DIAGNOSIS — R059 Cough, unspecified: Secondary | ICD-10-CM

## 2014-03-10 DIAGNOSIS — H6121 Impacted cerumen, right ear: Secondary | ICD-10-CM

## 2014-03-10 DIAGNOSIS — R0602 Shortness of breath: Secondary | ICD-10-CM

## 2014-03-10 DIAGNOSIS — J440 Chronic obstructive pulmonary disease with acute lower respiratory infection: Secondary | ICD-10-CM

## 2014-03-10 DIAGNOSIS — R079 Chest pain, unspecified: Secondary | ICD-10-CM

## 2014-03-10 DIAGNOSIS — R05 Cough: Secondary | ICD-10-CM

## 2014-03-10 MED ORDER — PROMETHAZINE-CODEINE 6.25-10 MG/5ML PO SYRP: 5.0000 mL | ORAL_SOLUTION | ORAL | Status: DC | PRN

## 2014-03-10 MED ORDER — LEVOFLOXACIN 500 MG PO TABS
500.0000 mg | ORAL_TABLET | Freq: Every day | ORAL | Status: AC
Start: 2014-03-10 — End: 2014-03-20

## 2014-03-10 MED ORDER — MOMETASONE FURO-FORMOTEROL FUM 100-5 MCG/ACT IN AERO: 2.0000 | INHALATION_SPRAY | Freq: Two times a day (BID) | RESPIRATORY_TRACT | Status: DC

## 2014-03-10 NOTE — Progress Notes (Signed)
[facility] [address], [place] [id number] [phone]  Date:  03/10/2014   Name:  Anthony Mayo   DOB:  06/11/1955   MRN:  [id number]  PCP:  Tally DueGUEST, CHRIS WARREN, MD    Chief Complaint: Otalgia and Chest Congestion   History of Present Illness:  Anthony EonHarry J Hinde is a 58 y.o. very pleasant male patient who presents with the following:  Cough and congestion in chest for years.  Says worse now with cough productive purulent sputum.  Some increase in wheezing No shortness of breath. No nausea or vomiting.  No stool change. No chest pain. Not compliant with inhalers. No fever or chills.   No improvement with over the counter medications or other home remedies.  Denies other complaint or health concern today.   Patient Active Problem List   Diagnosis Date Noted  . COPD (chronic obstructive pulmonary disease) 03/24/2012  . Asthma 03/19/2012    Past Medical History  Diagnosis Date  . Asthma   . COPD (chronic obstructive pulmonary disease)   . Gout     Past Surgical History  Procedure Laterality Date  . Replacement total knee  12/11/2010    History  Substance Use Topics  . Smoking status: Former Games developermoker  . Smokeless tobacco: Current User    Types: Snuff  . Alcohol Use: 14.4 oz/week    24 Cans of beer per week    Family History  Problem Relation Age of Onset  . Prostate cancer Father     No Known Allergies  Medication list has been reviewed and updated.  Current Outpatient Prescriptions on File Prior to Visit  Medication Sig Dispense Refill  . albuterol (PROVENTIL HFA;VENTOLIN HFA) 108 (90 BASE) MCG/ACT inhaler Inhale 2 puffs into the lungs every 6 (six) hours as needed.  1 Inhaler  6  . doxycycline (VIBRAMYCIN) 100 MG capsule Take 1 capsule (100 mg total) by mouth 2 (two) times daily.  20 capsule  0  . HYDROcodone-homatropine (HYCODAN) 5-1.5 MG/5ML syrup Take 5 mLs by mouth every 8 (eight) hours as needed for cough.  120 mL  0  .  indomethacin (INDOCIN) 50 MG capsule TAKE 1 CAPSULE THREE TIMES A DAY AS NEEDED  90 capsule  0  . methylPREDNIsolone (MEDROL DOSPACK) 4 MG tablet follow package directions  21 tablet  0  . mometasone-formoterol (DULERA) 100-5 MCG/ACT AERO Inhale 2 puffs into the lungs 2 (two) times daily.  1 Inhaler  6  . tiotropium (SPIRIVA HANDIHALER) 18 MCG inhalation capsule Place 1 capsule (18 mcg total) into inhaler and inhale daily.  30 capsule  12   No current facility-administered medications on file prior to visit.    Review of Systems:  As per HPI, otherwise negative.    Physical Examination: There were no vitals filed for this visit. There were no vitals filed for this visit. There is no weight on file to calculate BMI. Ideal Body Weight:    GEN: WDWN, NAD, Non-toxic, A & O x 3 HEENT: Atraumatic, Normocephalic. Neck supple. No masses, No LAD.  Right ear cerumen impaction Ears and Nose: No external deformity. CV: RRR, No M/G/R. No JVD. No thrill. No extra heart sounds. PULM: CTA B, no wheezes, crackles, rhonchi. No retractions. No resp. distress. No accessory muscle use. ABD: S, NT, ND, +BS. No rebound. No HSM. EXTR: No c/c/e NEURO Normal gait.  PSYCH: Normally interactive. Conversant. Not depressed or anxious appearing.  Calm demeanor.    Assessment  and Plan: Cleared cerumen impaction atraumatically Exacerbation COPD  Signed,  Phillips OdorJeffery Ayjah Show, MD   [facility] reading (PRIMARY) by  Dr. Dareen PianoAnderson.  No change  ? Lesion in left base.

## 2014-03-10 NOTE — Patient Instructions (Signed)
Smoking Cessation Quitting smoking is important to your health and has many advantages. However, it is not always easy to quit since nicotine is a very addictive drug. Oftentimes, people try 3 times or more before being able to quit. This document explains the best ways for you to prepare to quit smoking. Quitting takes hard work and a lot of effort, but you can do it. ADVANTAGES OF QUITTING SMOKING  You will live longer, feel better, and live better.  Your body will feel the impact of quitting smoking almost immediately.  Within 20 minutes, blood pressure decreases. Your pulse returns to its normal level.  After 8 hours, carbon monoxide levels in the blood return to normal. Your oxygen level increases.  After 24 hours, the chance of having a heart attack starts to decrease. Your breath, hair, and body stop smelling like smoke.  After 48 hours, damaged nerve endings begin to recover. Your sense of taste and smell improve.  After 72 hours, the body is virtually free of nicotine. Your bronchial tubes relax and breathing becomes easier.  After 2 to 12 weeks, lungs can hold more air. Exercise becomes easier and circulation improves.  The risk of having a heart attack, stroke, cancer, or lung disease is greatly reduced.  After 1 year, the risk of coronary heart disease is cut in half.  After 5 years, the risk of stroke falls to the same as a nonsmoker.  After 10 years, the risk of lung cancer is cut in half and the risk of other cancers decreases significantly.  After 15 years, the risk of coronary heart disease drops, usually to the level of a nonsmoker.  If you are pregnant, quitting smoking will improve your chances of having a healthy baby.  The people you live with, especially any children, will be healthier.  You will have extra money to spend on things other than cigarettes. QUESTIONS TO THINK ABOUT BEFORE ATTEMPTING TO QUIT You may want to talk about your answers with your  health care provider.  Why do you want to quit?  If you tried to quit in the past, what helped and what did not?  What will be the most difficult situations for you after you quit? How will you plan to handle them?  Who can help you through the tough times? Your family? Friends? A health care provider?  What pleasures do you get from smoking? What ways can you still get pleasure if you quit? Here are some questions to ask your health care provider:  How can you help me to be successful at quitting?  What medicine do you think would be best for me and how should I take it?  What should I do if I need more help?  What is smoking withdrawal like? How can I get information on withdrawal? GET READY  Set a quit date.  Change your environment by getting rid of all cigarettes, ashtrays, matches, and lighters in your home, car, or work. Do not let people smoke in your home.  Review your past attempts to quit. Think about what worked and what did not. GET SUPPORT AND ENCOURAGEMENT You have a better chance of being successful if you have help. You can get support in many ways.  Tell your family, friends, and coworkers that you are going to quit and need their support. Ask them not to smoke around you.  Get individual, group, or telephone counseling and support. Programs are available at local hospitals and health centers. Call   your local health department for information about programs in your area.  Spiritual beliefs and practices may help some smokers quit.  Download a "quit meter" on your computer to keep track of quit statistics, such as how long you have gone without smoking, cigarettes not smoked, and money saved.  Get a self-help book about quitting smoking and staying off tobacco. LEARN NEW SKILLS AND BEHAVIORS  Distract yourself from urges to smoke. Talk to someone, go for a walk, or occupy your time with a task.  Change your normal routine. Take a different route to work.  Drink tea instead of coffee. Eat breakfast in a different place.  Reduce your stress. Take a hot bath, exercise, or read a book.  Plan something enjoyable to do every day. Reward yourself for not smoking.  Explore interactive web-based programs that specialize in helping you quit. GET MEDICINE AND USE IT CORRECTLY Medicines can help you stop smoking and decrease the urge to smoke. Combining medicine with the above behavioral methods and support can greatly increase your chances of successfully quitting smoking.  Nicotine replacement therapy helps deliver nicotine to your body without the negative effects and risks of smoking. Nicotine replacement therapy includes nicotine gum, lozenges, inhalers, nasal sprays, and skin patches. Some may be available over-the-counter and others require a prescription.  Antidepressant medicine helps people abstain from smoking, but how this works is unknown. This medicine is available by prescription.  Nicotinic receptor partial agonist medicine simulates the effect of nicotine in your brain. This medicine is available by prescription. Ask your health care provider for advice about which medicines to use and how to use them based on your health history. Your health care provider will tell you what side effects to look out for if you choose to be on a medicine or therapy. Carefully read the information on the package. Do not use any other product containing nicotine while using a nicotine replacement product.  RELAPSE OR DIFFICULT SITUATIONS Most relapses occur within the first 3 months after quitting. Do not be discouraged if you start smoking again. Remember, most people try several times before finally quitting. You may have symptoms of withdrawal because your body is used to nicotine. You may crave cigarettes, be irritable, feel very hungry, cough often, get headaches, or have difficulty concentrating. The withdrawal symptoms are only temporary. They are strongest  when you first quit, but they will go away within 10-14 days. To reduce the chances of relapse, try to:  Avoid drinking alcohol. Drinking lowers your chances of successfully quitting.  Reduce the amount of caffeine you consume. Once you quit smoking, the amount of caffeine in your body increases and can give you symptoms, such as a rapid heartbeat, sweating, and anxiety.  Avoid smokers because they can make you want to smoke.  Do not let weight gain distract you. Many smokers will gain weight when they quit, usually less than 10 pounds. Eat a healthy diet and stay active. You can always lose the weight gained after you quit.  Find ways to improve your mood other than smoking. FOR MORE INFORMATION  www.smokefree.gov  Document Released: 04/30/2001 Document Revised: 09/20/2013 Document Reviewed: 08/15/2011 Exit[employer] Patient Information 2015 [employer]. This information is not intended to replace advice given to you by your health care provider. Make sure you discuss any questions you have with your health care provider.  

## 2014-04-13 ENCOUNTER — Other Ambulatory Visit: Payer: Self-pay | Admitting: Family Medicine

## 2014-04-18 ENCOUNTER — Ambulatory Visit (INDEPENDENT_AMBULATORY_CARE_PROVIDER_SITE_OTHER): Payer: BC Managed Care – PPO | Admitting: Family Medicine

## 2014-04-18 VITALS — BP 160/88 | HR 73 | Temp 98.1°F | Resp 16 | Ht 71.5 in | Wt 179.8 lb

## 2014-04-18 DIAGNOSIS — M10072 Idiopathic gout, left ankle and foot: Secondary | ICD-10-CM

## 2014-04-18 DIAGNOSIS — M1A09X0 Idiopathic chronic gout, multiple sites, without tophus (tophi): Secondary | ICD-10-CM

## 2014-04-18 DIAGNOSIS — R0602 Shortness of breath: Secondary | ICD-10-CM

## 2014-04-18 DIAGNOSIS — Z23 Encounter for immunization: Secondary | ICD-10-CM

## 2014-04-18 DIAGNOSIS — M109 Gout, unspecified: Secondary | ICD-10-CM

## 2014-04-18 DIAGNOSIS — J449 Chronic obstructive pulmonary disease, unspecified: Secondary | ICD-10-CM

## 2014-04-18 LAB — POCT CBC
Granulocyte percent: 67.2 %G (ref 37–80)
HCT, POC: 45.6 % (ref 43.5–53.7)
HEMOGLOBIN: 14.7 g/dL (ref 14.1–18.1)
Lymph, poc: 2.5 (ref 0.6–3.4)
MCH: 30.4 pg (ref 27–31.2)
MCHC: 32.4 g/dL (ref 31.8–35.4)
MCV: 94 fL (ref 80–97)
MID (CBC): 0.4 (ref 0–0.9)
MPV: 6.8 fL (ref 0–99.8)
PLATELET COUNT, POC: 299 10*3/uL (ref 142–424)
POC Granulocyte: 5.9 (ref 2–6.9)
POC LYMPH PERCENT: 28.2 %L (ref 10–50)
POC MID %: 4.6 %M (ref 0–12)
RBC: 4.85 M/uL (ref 4.69–6.13)
RDW, POC: 14.7 %
WBC: 8.8 10*3/uL (ref 4.6–10.2)

## 2014-04-18 MED ORDER — MOMETASONE FURO-FORMOTEROL FUM 100-5 MCG/ACT IN AERO: 2.0000 | INHALATION_SPRAY | Freq: Two times a day (BID) | RESPIRATORY_TRACT | Status: DC

## 2014-04-18 MED ORDER — METHYLPREDNISOLONE (PAK) 4 MG PO TABS: ORAL_TABLET | ORAL | Status: DC

## 2014-04-18 MED ORDER — ALBUTEROL SULFATE HFA 108 (90 BASE) MCG/ACT IN AERS: 2.0000 | INHALATION_SPRAY | Freq: Four times a day (QID) | RESPIRATORY_TRACT | Status: DC | PRN

## 2014-04-18 MED ORDER — INDOMETHACIN 50 MG PO CAPS: ORAL_CAPSULE | ORAL | Status: DC

## 2014-04-18 NOTE — Progress Notes (Signed)
Subjective: Patient has a long history of gout. He has never been on any allopurinol or equivalent medication. He is taking indomethacin off and on whenever he has flares. He is out of it and has been taking ibuprofen for a long time but it keeps flaring up. Today it's even worse and he couldn't wear his deltoid shoes. He hurts in his left foot.  He has a long history of COPD. He is not a smoker. [facility], but he is usually been using it on a when necessary basis. He had an albuterol inhaler which he no longer has because it was used to. He otherwise feels well and has no major complaints.  Objective: Chest clear but somewhat decreased airflow. No obvious wheezes. Heart regular without murmurs gallops or arrhythmias. Abdomen soft and nontender. His left foot has redness across the lateral 3 toes at the MTP joint area and dorsum of the foot. This is very painful to touch.  Assessment: Acute gouty arthritis and foot pain COPD  Plan: Refill his medications. Instructed him to use the [facility] regularly. Did give a taper of steroids. Return in about 4 months for recheck. I will let her know the results of his labs.

## 2014-04-18 NOTE — Patient Instructions (Signed)
I will let you know the results of your blood work in a few days. If you do not hear from me by early next week please call back. My plan is to put you on long-term gout medication.  Use the dulera twice daily on a regular basis.  Use the albuterol only when needed for wheezing or shortness of breath  Plan to return in 3-4 months to recheck on the gout

## 2014-04-19 LAB — COMPREHENSIVE METABOLIC PANEL
ALBUMIN: 3.8 g/dL (ref 3.5–5.2)
ALT: 18 U/L (ref 0–53)
AST: 21 U/L (ref 0–37)
Alkaline Phosphatase: 49 U/L (ref 39–117)
BUN: 16 mg/dL (ref 6–23)
CALCIUM: 9.2 mg/dL (ref 8.4–10.5)
CHLORIDE: 105 meq/L (ref 96–112)
CO2: 28 meq/L (ref 19–32)
CREATININE: 0.9 mg/dL (ref 0.50–1.35)
GLUCOSE: 79 mg/dL (ref 70–99)
POTASSIUM: 4.9 meq/L (ref 3.5–5.3)
Sodium: 142 mEq/L (ref 135–145)
Total Bilirubin: 0.4 mg/dL (ref 0.2–1.2)
Total Protein: 6.9 g/dL (ref 6.0–8.3)

## 2014-04-19 LAB — URIC ACID: Uric Acid, Serum: 8.3 mg/dL — ABNORMAL HIGH (ref 4.0–7.8)

## 2014-04-24 ENCOUNTER — Other Ambulatory Visit: Payer: Self-pay | Admitting: *Deleted

## 2014-04-24 DIAGNOSIS — M109 Gout, unspecified: Secondary | ICD-10-CM

## 2014-04-24 MED ORDER — ALLOPURINOL 100 MG PO TABS: 100.0000 mg | ORAL_TABLET | Freq: Every day | ORAL | Status: DC

## 2014-04-26 ENCOUNTER — Telehealth: Payer: Self-pay | Admitting: Family Medicine

## 2014-04-26 NOTE — Telephone Encounter (Signed)
Left a message for patient to come in and get the flu shot or either call and let us know when and where he received the flu shot so that we can update his record.

## 2014-05-30 ENCOUNTER — Telehealth: Payer: Self-pay | Admitting: *Deleted

## 2014-05-30 NOTE — Telephone Encounter (Signed)
LMOM for pt to call the office regarding the flu shot. 

## 2015-05-02 ENCOUNTER — Other Ambulatory Visit: Payer: Self-pay | Admitting: Family Medicine

## 2015-06-04 ENCOUNTER — Other Ambulatory Visit: Payer: Self-pay | Admitting: Family Medicine

## 2017-04-01 ENCOUNTER — Encounter: Payer: Self-pay | Admitting: Emergency Medicine

## 2017-04-01 ENCOUNTER — Ambulatory Visit: Payer: BLUE CROSS/BLUE SHIELD | Admitting: Emergency Medicine

## 2017-04-01 ENCOUNTER — Ambulatory Visit (INDEPENDENT_AMBULATORY_CARE_PROVIDER_SITE_OTHER): Payer: BLUE CROSS/BLUE SHIELD

## 2017-04-01 VITALS — BP 140/74 | HR 95 | Temp 100.9°F | Resp 17 | Ht 71.5 in | Wt 161.0 lb

## 2017-04-01 DIAGNOSIS — R0602 Shortness of breath: Secondary | ICD-10-CM

## 2017-04-01 DIAGNOSIS — J209 Acute bronchitis, unspecified: Secondary | ICD-10-CM | POA: Diagnosis not present

## 2017-04-01 DIAGNOSIS — R0609 Other forms of dyspnea: Secondary | ICD-10-CM

## 2017-04-01 DIAGNOSIS — J441 Chronic obstructive pulmonary disease with (acute) exacerbation: Secondary | ICD-10-CM

## 2017-04-01 DIAGNOSIS — R0600 Dyspnea, unspecified: Secondary | ICD-10-CM | POA: Insufficient documentation

## 2017-04-01 MED ORDER — ALBUTEROL SULFATE HFA 108 (90 BASE) MCG/ACT IN AERS: 2.0000 | INHALATION_SPRAY | Freq: Four times a day (QID) | RESPIRATORY_TRACT | 11 refills | Status: DC | PRN

## 2017-04-01 MED ORDER — AMOXICILLIN-POT CLAVULANATE 875-125 MG PO TABS
1.0000 | ORAL_TABLET | Freq: Two times a day (BID) | ORAL | 0 refills | Status: AC
Start: 2017-04-01 — End: 2017-04-08

## 2017-04-01 MED ORDER — MOMETASONE FURO-FORMOTEROL FUM 100-5 MCG/ACT IN AERO: INHALATION_SPRAY | RESPIRATORY_TRACT | 11 refills | Status: DC

## 2017-04-01 MED ORDER — PREDNISONE 20 MG PO TABS: 40.0000 mg | ORAL_TABLET | Freq: Every day | ORAL | 0 refills | Status: AC

## 2017-04-01 NOTE — Patient Instructions (Addendum)
 Chronic Obstructive Pulmonary Disease Chronic obstructive pulmonary disease (COPD) is a long-term (chronic) lung problem. When you have COPD, it is hard for air to get in and out of your lungs. The way your lungs work will never return to normal. Usually the condition gets worse over time. There are things you can do to keep yourself as healthy as possible. Your doctor may treat your condition with:  Medicines.  Quitting smoking, if you smoke.  Rehabilitation. This may involve a team of specialists.  Oxygen.  Exercise and changes to your diet.  Lung surgery.  Comfort measures (palliative care).  Follow these instructions at home: Medicines  Take over-the-counter and prescription medicines only as told by your doctor.  Talk to your doctor before taking any cough or allergy medicines. You may need to avoid medicines that cause your lungs to be dry. Lifestyle  If you smoke, stop. Smoking makes the problem worse. If you need help quitting, ask your doctor.  Avoid being around things that make your breathing worse. This may include smoke, chemicals, and fumes.  Stay active, but remember to also rest.  Learn and use tips on how to relax.  Make sure you get enough sleep. Most adults need at least 7 hours a night.  Eat healthy foods. Eat smaller meals more often. Rest before meals. Controlled breathing  Learn and use tips on how to control your breathing as told by your doctor. Try: ? Breathing in (inhaling) through your nose for 1 second. Then, pucker your lips and breath out (exhale) through your lips for 2 seconds. ? Putting one hand on your belly (abdomen). Breathe in slowly through your nose for 1 second. Your hand on your belly should move out. Pucker your lips and breathe out slowly through your lips. Your hand on your belly should move in as you breathe out. Controlled coughing  Learn and use controlled coughing to clear mucus from your lungs. The steps are: 1. Lean  your head a little forward. 2. Breathe in deeply. 3. Try to hold your breath for 3 seconds. 4. Keep your mouth slightly open while coughing 2 times. 5. Spit any mucus out into a tissue. 6. Rest and do the steps again 1 or 2 times as needed. General instructions  Make sure you get all the shots (vaccines) that your doctor recommends. Ask your doctor about a flu shot and a pneumonia shot.  Use oxygen therapy and therapy to help improve your lungs (pulmonary rehabilitation) if told by your doctor. If you need home oxygen therapy, ask your doctor if you should buy a tool to measure your oxygen level (oximeter).  Make a COPD action plan with your doctor. This helps you know what to do if you feel worse than usual.  Manage any other conditions you have as told by your doctor.  Avoid going outside when it is very hot, cold, or humid.  Avoid people who have a sickness you can catch (contagious).  Keep all follow-up visits as told by your doctor. This is important. Contact a doctor if:  You cough up more mucus than usual.  There is a change in the color or thickness of the mucus.  It is harder to breathe than usual.  Your breathing is faster than usual.  You have trouble sleeping.  You need to use your medicines more often than usual.  You have trouble doing your normal activities such as getting dressed or walking around the house. Get help right away   if:  You have shortness of breath while resting.  You have shortness of breath that stops you from: ? Being able to talk. ? Doing normal activities.  Your chest hurts for longer than 5 minutes.  Your skin color is more blue than usual.  Your pulse oximeter shows that you have low oxygen for longer than 5 minutes.  You have a fever.  You feel too tired to breathe normally. Summary  Chronic obstructive pulmonary disease (COPD) is a long-term lung problem.  The way your lungs work will never return to normal. Usually the  condition gets worse over time. There are things you can do to keep yourself as healthy as possible.  Take over-the-counter and prescription medicines only as told by your doctor.  If you smoke, stop. Smoking makes the problem worse. This information is not intended to replace advice given to you by your health care provider. Make sure you discuss any questions you have with your health care provider. Document Released: 10/23/2007 Document Revised: 10/12/2015 Document Reviewed: 12/31/2012 Elsevier Interactive Patient Education  2017 Elsevier Inc.    IF you received an x-ray today, you will receive an invoice from [facility]. Please contact [facility] at [phone] with questions or concerns regarding your invoice.   IF you received labwork today, you will receive an invoice from [facility]. Please contact [facility] at [phone] with questions or concerns regarding your invoice.   Our billing staff will not be able to assist you with questions regarding bills from these companies.  You will be contacted with the lab results as soon as they are available. The fastest way to get your results is to activate your My Chart account. Instructions are located on the last page of this paperwork. If you have not heard from us regarding the results in 2 weeks, please contact this office.      

## 2017-04-01 NOTE — Progress Notes (Signed)
Anthony KingfisherHarry Mayo 61 y.o.   Chief Complaint  Patient presents with  . Cough  . URI  . Medication Refill    ALL MEDICATION WITH RFs BESIDE THEM    HISTORY OF PRESENT ILLNESS: This is a 61 y.o. male complaining of cough and sob x several days; has h/o COPD but needs medication refills.  URI   This is a new problem. The current episode started in the past 7 days. The problem has been gradually worsening. There has been no fever. Associated symptoms include congestion, coughing and wheezing. Pertinent negatives include no abdominal pain, chest pain, diarrhea, dysuria, headaches, nausea, neck pain, rash, sinus pain, sore throat, swollen glands or vomiting. He has tried nothing for the symptoms.     Prior to Admission medications   Medication Sig Start Date End Date Taking? Authorizing Provider  albuterol (PROVENTIL HFA;VENTOLIN HFA) 108 (90 BASE) MCG/ACT inhaler Inhale 2 puffs into the lungs every 6 (six) hours as needed. 04/18/14  Yes Peyton NajjarHopper, David H, MD  indomethacin (INDOCIN) 50 MG capsule Take 50 mg 3 times daily only when needed for flares of gout 04/18/14  Yes Peyton NajjarHopper, David H, MD  MELATONIN GUMMIES PO Take at bedtime by mouth.   Yes [provider]  allopurinol (ZYLOPRIM) 100 MG tablet Take 1 tablet (100 mg total) by mouth daily. Patient not taking: Reported on 04/01/2017 04/24/14   Peyton NajjarHopper, David H, MD  methylPREDNIsolone (MEDROL [facility]) 4 MG tablet follow package directions Patient not taking: Reported on 04/01/2017 04/18/14   Peyton NajjarHopper, David H, MD  mometasone-formoterol (DULERA) 100-5 MCG/ACT AERO INHALE 2 PUFFS INTO THE LUNGS 2 (TWO) TIMES DAILY   "OFFICE VISIT NEEDED FOR REFILLS" Patient not taking: Reported on 04/01/2017 05/02/15   Peyton NajjarHopper, David H, MD  promethazine-codeine (PHENERGAN WITH CODEINE) 6.25-10 MG/5ML syrup Take 5-10 mLs by mouth every 4 (four) hours as needed. Patient not taking: Reported on 04/18/2014 03/10/14   Carmelina DaneAnderson, Jeffery S, MD    No Known  Allergies  Patient Active Problem List   Diagnosis Date Noted  . COPD (chronic obstructive pulmonary disease) (HCC) 03/24/2012  . Asthma 03/19/2012    Past Medical History:  Diagnosis Date  . Asthma   . COPD (chronic obstructive pulmonary disease) (HCC)   . Gout     Past Surgical History:  Procedure Laterality Date  . REPLACEMENT TOTAL KNEE  12/11/2010    Social History   Socioeconomic History  . Marital status: Married    Spouse name: Not on file  . Number of children: Not on file  . Years of education: Not on file  . Highest education level: Not on file  Social Needs  . Financial resource strain: Not on file  . Food insecurity - worry: Not on file  . Food insecurity - inability: Not on file  . Transportation needs - medical: Not on file  . Transportation needs - non-medical: Not on file  Occupational History  . Not on file  Tobacco Use  . Smoking status: Former Games developermoker  . Smokeless tobacco: Current User    Types: Snuff  Substance and Sexual Activity  . Alcohol use: Yes    Alcohol/week: 14.4 oz    Types: 24 Cans of beer per week  . Drug use: No  . Sexual activity: Not on file  Other Topics Concern  . Not on file  Social History Narrative  . Not on file    Family History  Problem Relation Age of Onset  . Prostate cancer Father  Review of Systems  Constitutional: Negative for chills, fever and weight loss.  HENT: Positive for congestion. Negative for nosebleeds, sinus pain and sore throat.   Eyes: Negative for discharge and redness.  Respiratory: Positive for cough, sputum production, shortness of breath (DOE) and wheezing. Negative for hemoptysis.   Cardiovascular: Negative for chest pain, palpitations and claudication.  Gastrointestinal: Negative for abdominal pain, diarrhea, nausea and vomiting.  Genitourinary: Negative for dysuria and hematuria.  Musculoskeletal: Negative for neck pain.  Skin: Negative for rash.  Neurological: Positive for  weakness. Negative for dizziness and headaches.  Endo/Heme/Allergies: Negative.   All other systems reviewed and are negative.  Vitals:   04/01/17 1158  BP: 140/74  Pulse: 95  Resp: 17  Temp: (!) 100.9 F (38.3 C)  SpO2: 98%     Physical Exam  Constitutional: He is oriented to person, place, and time. He appears well-developed and well-nourished.  HENT:  Head: Normocephalic and atraumatic.  Nose: Nose normal.  Mouth/Throat: Oropharynx is clear and moist. No oropharyngeal exudate.  Eyes: Conjunctivae and EOM are normal. Pupils are equal, round, and reactive to light.  Neck: Normal range of motion. Neck supple. No JVD present.  Cardiovascular: Normal rate, regular rhythm, normal heart sounds and intact distal pulses.  Pulmonary/Chest: Effort normal. He has wheezes (scant). He has no rales.  Abdominal: Soft. There is no tenderness.  Musculoskeletal: Normal range of motion.  Lymphadenopathy:    He has no cervical adenopathy.  Neurological: He is alert and oriented to person, place, and time. No sensory deficit. He exhibits normal muscle tone.  Skin: Skin is warm and dry. Capillary refill takes less than 2 seconds. No rash noted.  Psychiatric: He has a normal mood and affect. His behavior is normal.  Vitals reviewed.    ASSESSMENT & PLAN: Anthony Mayo was seen today for cough, uri and medication refill.  Diagnoses and all orders for this visit:  Chronic obstructive pulmonary disease with acute exacerbation (HCC) -     DG Chest 2 View; Future -     Ambulatory referral to Pulmonology  SOB (shortness of breath) -     albuterol (PROVENTIL HFA;VENTOLIN HFA) 108 (90 Base) MCG/ACT inhaler; Inhale 2 puffs every 6 (six) hours as needed into the lungs. -     DG Chest 2 View; Future  Acute bronchitis, unspecified organism  Other orders -     mometasone-formoterol (DULERA) 100-5 MCG/ACT AERO; INHALE 2 PUFFS INTO THE LUNGS 2 (TWO) TIMES DAILY -     amoxicillin-clavulanate (AUGMENTIN)  875-125 MG tablet; Take 1 tablet 2 (two) times daily for 7 days by mouth. -     predniSONE (DELTASONE) 20 MG tablet; Take 2 tablets (40 mg total) daily with breakfast for 5 days by mouth.    Patient Instructions       Chronic Obstructive Pulmonary Disease Chronic obstructive pulmonary disease (COPD) is a long-term (chronic) lung problem. When you have COPD, it is hard for air to get in and out of your lungs. The way your lungs work will never return to normal. Usually the condition gets worse over time. There are things you can do to keep yourself as healthy as possible. Your doctor may treat your condition with:  Medicines.  Quitting smoking, if you smoke.  Rehabilitation. This may involve a team of specialists.  Oxygen.  Exercise and changes to your diet.  Lung surgery.  Comfort measures (palliative care).  Follow these instructions at home: Medicines  Take over-the-counter and prescription medicines only  as told by your doctor.  Talk to your doctor before taking any cough or allergy medicines. You may need to avoid medicines that cause your lungs to be dry. Lifestyle  If you smoke, stop. Smoking makes the problem worse. If you need help quitting, ask your doctor.  Avoid being around things that make your breathing worse. This may include smoke, chemicals, and fumes.  Stay active, but remember to also rest.  Learn and use tips on how to relax.  Make sure you get enough sleep. Most adults need at least 7 hours a night.  Eat healthy foods. Eat smaller meals more often. Rest before meals. Controlled breathing  Learn and use tips on how to control your breathing as told by your doctor. Try: ? Breathing in (inhaling) through your nose for 1 second. Then, pucker your lips and breath out (exhale) through your lips for 2 seconds. ? Putting one hand on your belly (abdomen). Breathe in slowly through your nose for 1 second. Your hand on your belly should move out. Pucker  your lips and breathe out slowly through your lips. Your hand on your belly should move in as you breathe out. Controlled coughing  Learn and use controlled coughing to clear mucus from your lungs. The steps are: 1. Lean your head a little forward. 2. Breathe in deeply. 3. Try to hold your breath for 3 seconds. 4. Keep your mouth slightly open while coughing 2 times. 5. Spit any mucus out into a tissue. 6. Rest and do the steps again 1 or 2 times as needed. General instructions  Make sure you get all the shots (vaccines) that your doctor recommends. Ask your doctor about a flu shot and a pneumonia shot.  Use oxygen therapy and therapy to help improve your lungs (pulmonary rehabilitation) if told by your doctor. If you need home oxygen therapy, ask your doctor if you should buy a tool to measure your oxygen level (oximeter).  Make a COPD action plan with your doctor. This helps you know what to do if you feel worse than usual.  Manage any other conditions you have as told by your doctor.  Avoid going outside when it is very hot, cold, or humid.  Avoid people who have a sickness you can catch (contagious).  Keep all follow-up visits as told by your doctor. This is important. Contact a doctor if:  You cough up more mucus than usual.  There is a change in the color or thickness of the mucus.  It is harder to breathe than usual.  Your breathing is faster than usual.  You have trouble sleeping.  You need to use your medicines more often than usual.  You have trouble doing your normal activities such as getting dressed or walking around the house. Get help right away if:  You have shortness of breath while resting.  You have shortness of breath that stops you from: ? Being able to talk. ? Doing normal activities.  Your chest hurts for longer than 5 minutes.  Your skin color is more blue than usual.  Your pulse oximeter shows that you have low oxygen for longer than 5  minutes.  You have a fever.  You feel too tired to breathe normally. Summary  Chronic obstructive pulmonary disease (COPD) is a long-term lung problem.  The way your lungs work will never return to normal. Usually the condition gets worse over time. There are things you can do to keep yourself as healthy as possible.  Take over-the-counter and prescription medicines only as told by your doctor.  If you smoke, stop. Smoking makes the problem worse. This information is not intended to replace advice given to you by your health care provider. Make sure you discuss any questions you have with your health care provider. Document Released: 10/23/2007 Document Revised: 10/12/2015 Document Reviewed: 12/31/2012 Elsevier Interactive Patient Education  2017 [employer].  IF you received an x-ray today, you will receive an invoice from [facility]. Please contact [facility] at [phone] with questions or concerns regarding your invoice.   IF you received labwork today, you will receive an invoice from [facility]. Please contact [facility] at [phone] with questions or concerns regarding your invoice.   Our billing staff will not be able to assist you with questions regarding bills from these companies.  You will be contacted with the lab results as soon as they are available. The fastest way to get your results is to activate your My Chart account. Instructions are located on the last page of this paperwork. If you have not heard from [place] regarding the results in 2 weeks, please contact this office.         Edwina Barth, MD [facility]

## 2017-04-02 ENCOUNTER — Telehealth: Payer: Self-pay | Admitting: Internal Medicine

## 2017-04-02 NOTE — Telephone Encounter (Signed)
Copied from CRM [id number]. Topic: Quick Communication - See Telephone Encounter >> Apr 02, 2017  8:40 AM Trula SladeWalter, Linda F wrote: CRM for notification. See Telephone encounter for:  04/02/17. Patient was in to see the doctor yesterday, and he said one of the medications was left off the list.  It was his Indomethacin for gout. He would like it sent to the  [facility] on [address].

## 2017-04-02 NOTE — Telephone Encounter (Signed)
Pt is calling to check on medication. When medication is sent over pt would like a call back

## 2017-04-03 ENCOUNTER — Encounter: Payer: Self-pay | Admitting: Internal Medicine

## 2017-04-03 ENCOUNTER — Other Ambulatory Visit: Payer: Self-pay

## 2017-04-03 ENCOUNTER — Ambulatory Visit: Payer: BLUE CROSS/BLUE SHIELD | Admitting: Internal Medicine

## 2017-04-03 ENCOUNTER — Other Ambulatory Visit (INDEPENDENT_AMBULATORY_CARE_PROVIDER_SITE_OTHER): Payer: BLUE CROSS/BLUE SHIELD

## 2017-04-03 VITALS — BP 150/90 | HR 90 | Ht 71.5 in | Wt 167.4 lb

## 2017-04-03 DIAGNOSIS — R0609 Other forms of dyspnea: Secondary | ICD-10-CM | POA: Diagnosis not present

## 2017-04-03 DIAGNOSIS — J449 Chronic obstructive pulmonary disease, unspecified: Secondary | ICD-10-CM | POA: Diagnosis not present

## 2017-04-03 DIAGNOSIS — M109 Gout, unspecified: Secondary | ICD-10-CM

## 2017-04-03 DIAGNOSIS — F17210 Nicotine dependence, cigarettes, uncomplicated: Secondary | ICD-10-CM | POA: Diagnosis not present

## 2017-04-03 DIAGNOSIS — R0600 Dyspnea, unspecified: Secondary | ICD-10-CM

## 2017-04-03 LAB — CBC WITH DIFFERENTIAL/PLATELET
BASOS ABS: 0.1 10*3/uL (ref 0.0–0.1)
Basophils Relative: 0.7 % (ref 0.0–3.0)
Eosinophils Absolute: 0.3 10*3/uL (ref 0.0–0.7)
Eosinophils Relative: 1.9 % (ref 0.0–5.0)
HCT: 43 % (ref 39.0–52.0)
Hemoglobin: 14.3 g/dL (ref 13.0–17.0)
LYMPHS ABS: 2.5 10*3/uL (ref 0.7–4.0)
LYMPHS PCT: 17.4 % (ref 12.0–46.0)
MCHC: 33.3 g/dL (ref 30.0–36.0)
MCV: 94.6 fl (ref 78.0–100.0)
MONOS PCT: 6 % (ref 3.0–12.0)
Monocytes Absolute: 0.9 10*3/uL (ref 0.1–1.0)
NEUTROS PCT: 74 % (ref 43.0–77.0)
Neutro Abs: 10.6 10*3/uL — ABNORMAL HIGH (ref 1.4–7.7)
Platelets: 325 10*3/uL (ref 150.0–400.0)
RBC: 4.55 Mil/uL (ref 4.22–5.81)
RDW: 13 % (ref 11.5–15.5)
WBC: 14.3 10*3/uL — AB (ref 4.0–10.5)

## 2017-04-03 LAB — HEPATIC FUNCTION PANEL
ALBUMIN: 3.7 g/dL (ref 3.5–5.2)
ALT: 11 U/L (ref 0–53)
AST: 13 U/L (ref 0–37)
Alkaline Phosphatase: 44 U/L (ref 39–117)
Bilirubin, Direct: 0.1 mg/dL (ref 0.0–0.3)
Total Bilirubin: 0.3 mg/dL (ref 0.2–1.2)
Total Protein: 7.3 g/dL (ref 6.0–8.3)

## 2017-04-03 LAB — BASIC METABOLIC PANEL
BUN: 17 mg/dL (ref 6–23)
CHLORIDE: 104 meq/L (ref 96–112)
CO2: 32 mEq/L (ref 19–32)
Calcium: 9.8 mg/dL (ref 8.4–10.5)
Creatinine, Ser: 1.1 mg/dL (ref 0.40–1.50)
GFR: 72.18 mL/min (ref >60.00)
Glucose, Bld: 100 mg/dL — ABNORMAL HIGH (ref 70–99)
POTASSIUM: 4.8 meq/L (ref 3.5–5.1)
Sodium: 143 mEq/L (ref 135–145)

## 2017-04-03 LAB — BRAIN NATRIURETIC PEPTIDE: Pro B Natriuretic peptide (BNP): 46 pg/mL (ref 0.0–100.0)

## 2017-04-03 LAB — TSH: TSH: 2.78 u[IU]/mL (ref 0.35–4.50)

## 2017-04-03 MED ORDER — TIOTROPIUM BROMIDE MONOHYDRATE 2.5 MCG/ACT IN AERS: 2.0000 | INHALATION_SPRAY | Freq: Every day | RESPIRATORY_TRACT | 11 refills | Status: DC

## 2017-04-03 MED ORDER — TIOTROPIUM BROMIDE MONOHYDRATE 2.5 MCG/ACT IN AERS: 2.0000 | INHALATION_SPRAY | Freq: Every day | RESPIRATORY_TRACT | 0 refills | Status: DC

## 2017-04-03 MED ORDER — MOMETASONE FURO-FORMOTEROL FUM 100-5 MCG/ACT IN AERO: INHALATION_SPRAY | RESPIRATORY_TRACT | 11 refills | Status: DC

## 2017-04-03 MED ORDER — INDOMETHACIN 50 MG PO CAPS: ORAL_CAPSULE | ORAL | 1 refills | Status: DC

## 2017-04-03 NOTE — Progress Notes (Signed)
Subjective:     Patient ID: Anthony Mayo, male   DOB: 09/07/1955,     MRN: [id number]  HPI  [age] yowm active smoker with dx of asthma/ bronchitis as child and very limited physically but could do swim team - never could do sports- seemed better around age 61-13  And by 20's doing better then around late 40's and [facility] 04/03/2017 by Dr   Alvy BimlerSagardia   04/03/2017 1st [facility] office visit/ Alem Fahl   Chief Complaint  Patient presents with  . Pulmonary Consult    Referred by Dr. Bobby RumpfMichael Sagardia. Pt states dxed with COPD approx 3 yrs ago. He c/o increased DOE for the past 3 months. He gets SOB when he lifts things and when he gets in a hurry. He states he has also had trouble keeping up with yard work. He is also coughing more- prod with yellow to clear sputum.  Cough states cough sometimes wakes him up. He coughs until he is dizzy and feels faint.   was doing some better on  dulera /saba worse started back up one day prior to OV  And  Some better Am of ov dulera 100 x 2 puffs, no saba  Bad am cough x sev tbsp clear mucus am of ov  Doe= MMRC2 = can't walk a nl pace on a flat grade s sob but does fine slow and flat eg walking at [employer] ok  Sleeping poorly due to restless, not cough or sob  Prednisone really helps transiently    No obvious day to day or daytime variability or assoc excess/ purulent sputum or mucus plugs or hemoptysis or cp or chest tightness, subjective wheeze or overt sinus or hb symptoms. No unusual exp hx or h/o childhood pna/ asthma or knowledge of premature birth.  Sleeping ok flat without nocturnal  or early am exacerbation  of respiratory  c/o's or need for noct saba. Also denies any obvious fluctuation of symptoms with weather or environmental changes or other aggravating or alleviating factors except as outlined above   Current Allergies, Complete Past Medical History, Past Surgical History, Family History, and Social  History were [facility] electronic [facility] record.  ROS  The following are not active complaints unless bolded Hoarseness, sore throat, dysphagia, dental problems, itching, sneezing,  nasal congestion or discharge of excess mucus or purulent secretions, ear ache,   fever, chills, sweats, unintended wt loss or wt gain, classically pleuritic or exertional cp,  orthopnea pnd or leg swelling, presyncope, palpitations, abdominal pain, anorexia, nausea, vomiting, diarrhea  or change in bowel habits or change in bladder habits, change in stools or change in urine, dysuria, hematuria,  rash, arthralgias, visual complaints, headache, numbness, weakness or ataxia or problems with walking or coordination,  change in mood/affect or memory.        Current Meds  Medication Sig  . albuterol (PROVENTIL HFA;VENTOLIN HFA) 108 (90 Base) MCG/ACT inhaler Inhale 2 puffs every 6 (six) hours as needed into the lungs.  Marland Kitchen. amoxicillin-clavulanate (AUGMENTIN) 875-125 MG tablet Take 1 tablet 2 (two) times daily for 7 days by mouth.  . MELATONIN GUMMIES PO Take at bedtime by mouth.  . mometasone-formoterol (DULERA) 100-5 MCG/ACT AERO INHALE 2 PUFFS INTO THE LUNGS 2 (TWO) TIMES DAILY  . predniSONE (DELTASONE) 20 MG tablet Take 2 tablets (40 mg total) daily with breakfast for 5 days by mouth.  Review of Systems     Objective:   Physical Exam amb pleasant mod hoarse wm nad   Wt Readings from Last 3 Encounters:  04/03/17 167 lb 6.4 oz (75.9 kg)  04/01/17 161 lb (73 kg)  04/18/14 179 lb 12.8 oz (81.6 kg)    Vital signs reviewed  - Note on arrival 02 sats  98% on RA and bp 150/90      HEENT: nl LOWER dentition, turbinates bilaterally, and oropharynx. Nl external ear canals without cough reflex - top denture    NECK :  without JVD/Nodes/TM/ nl carotid upstrokes bilaterally   LUNGS: no acc muscle use,  slt barrel contour chest with distant mid exp rhonchi bilaterally    CV:  RRR   no s3 or murmur or increase in P2, and no edema   ABD:  soft and nontender with nl inspiratory excursion in the supine position. No bruits or organomegaly appreciated, bowel sounds nl  MS:  Nl gait/ ext warm without deformities, calf tenderness, cyanosis or clubbing No obvious joint restrictions   SKIN: warm and dry without lesions    NEURO:  alert, approp, nl sensorium with  no motor or cerebellar deficits apparent.       I personally reviewed images and agree with radiology impression as follows:  CXR:   04/01/17 COPD. No pneumonia, CHF, nor other acute cardiopulmonary Abnormality.     Labs ordered/ reviewed:     Chemistry      Component Value Date/Time   NA 143 04/03/2017 1227   K 4.8 04/03/2017 1227   CL 104 04/03/2017 1227   CO2 32 04/03/2017 1227   BUN 17 04/03/2017 1227   CREATININE 1.10 04/03/2017 1227   CREATININE 0.90 04/18/2014 1535      Component Value Date/Time   CALCIUM 9.8 04/03/2017 1227   ALKPHOS 44 04/03/2017 1227   AST 13 04/03/2017 1227   ALT 11 04/03/2017 1227   BILITOT 0.3 04/03/2017 1227        Lab Results  Component Value Date   WBC 14.3 (H) 04/03/2017   HGB 14.3 04/03/2017   HCT 43.0 04/03/2017   MCV 94.6 04/03/2017   PLT 325.0 04/03/2017       EOS                       0.3                                                                   04/03/2017    Lab Results  Component Value Date   TSH 2.78 04/03/2017     Lab Results  Component Value Date   PROBNP 46.0 04/03/2017         Labs ordered 04/03/2017    Alpha one screening        Assessment:

## 2017-04-03 NOTE — Patient Instructions (Addendum)
Plan A = Automatic =  dulera 100 Take 2 puffs first thing in am chase with the spiriva x 2 pffs  and then another 2 puffs of just the dulera about 12 hours later     Plan B = Backup Only use your albuterol as a rescue medication to be used if you can't catch your breath by resting or doing a relaxed purse lip breathing pattern.  - The less you use it, the better it will work when you need it. - Ok to use the inhaler up to 2 puffs  every 4 hours if you must but call for appointment if use goes up over your usual need - Don't leave home without it !!  (think of it like the spare tire for your car)   The key is to stop smoking completely before smoking completely stops you!  For cough > mucinex dm up to 1200 mg every 12 hours as needed    Please remember to go to the lab department downstairs in the basement  for your tests - we will call you with the results when they are available.     Please schedule a follow up office visit in 4 weeks, sooner if needed  with all medications /inhalers/ solutions in hand so we can verify exactly what you are taking. This includes all medications from all doctors and over the counters

## 2017-04-04 ENCOUNTER — Encounter: Payer: Self-pay | Admitting: Internal Medicine

## 2017-04-04 ENCOUNTER — Telehealth: Payer: Self-pay | Admitting: Internal Medicine

## 2017-04-04 DIAGNOSIS — F17210 Nicotine dependence, cigarettes, uncomplicated: Secondary | ICD-10-CM | POA: Insufficient documentation

## 2017-04-04 NOTE — Assessment & Plan Note (Signed)

## 2017-04-04 NOTE — Assessment & Plan Note (Addendum)
Symptoms are   disproportionate to objective findings of fev1 > 2 liters  and not clear this is actually all a  lung problem but pt does appear to have difficult to sort out respiratory symptoms of unknown origin for which  DDX  = almost all start with A and  include Adherence, Ace Inhibitors, Acid Reflux, Active Sinus Disease, Alpha 1 Antitripsin deficiency, Anxiety masquerading as Airways dz,  ABPA,  Allergy(esp in young), Aspiration (esp in elderly), Adverse effects of meds,  Active smokers, A bunch of PE's (a small clot burden can't cause this syndrome unless there is already severe underlying pulm or vascular dz with poor reserve) plus two Bs  = Bronchiectasis and Beta blocker use..and one C= CHF     Adherence is always the initial "prime suspect" and is a multilayered concern that requires a "trust but verify" approach in every patient - starting with knowing how to use medications, especially inhalers, correctly, keeping up with refills and understanding the fundamental difference between maintenance and prns vs those medications only taken for a very short course and then stopped and not refilled.  - see hfa teachng - return with all meds in hand using a trust but verify approach to confirm accurate Medication  Reconciliation The principal here is that until we are certain that the  patients are doing what we've asked, it makes no sense to ask them to do more.    Active smoking top of the list of usual suspects   ? Allergy/ asthma component > note Eos 0.3 / may benefit from higher dose ics/ add singulair    ? Acid (or non-acid) GERD > always difficult to exclude as up to 75% of pts in some series report no assoc GI/ Heartburn symptoms> consider next  rec max (24h)  acid suppression and diet restriction trial  ?Anemia/thyroid dz > ruled out  ? Alpha one AT > send screen  ? Anxiety > usually at the bottom of this list of usual suspects   ? CHF > ruled out with low bnp

## 2017-04-04 NOTE — Assessment & Plan Note (Signed)
Spirometry 04/03/2017  FEV1 2.05 (54%)  Ratio 52 with atypical curvature p am dulera 100 x2 - 04/03/2017  After extensive coaching device effectiveness =    90% with SMI > add spiriva 100  His ongoing hoarseness and h/o severe cough to point of almost fainting suggest a component of uacs which is typically related to reflux or use of high dose ics or dpi in any dose so since he is apparently Group D copd added LAMA today with plans to f/u in 4-6 weeks and work on d/c smoking in meantime (see separate a/p)    Total time devoted to counseling  > 50 % of initial 60 min office visit:  review case with pt/ discussion of options/alternatives/ personally creating written customized instructions  in presence of pt  then going over those specific  Instructions directly with the pt including how to use all of the meds but in particular covering each new medication in detail and the difference between the maintenance= "automatic" meds and the prns using an action plan format for the latter (If this problem/symptom => do that organization reading Left to right).  Please see AVS from this visit for a full list of these instructions which I personally wrote for this pt and  are unique to this visit.

## 2017-04-04 NOTE — Telephone Encounter (Signed)
Notes recorded by Nyoka CowdenWert, Michael B, MD on 04/03/2017 at 5:13 PM EST Call patient : Studies are unremarkable, no change in recs  Called pt and advised message from the provider. Pt understood and verbalized understanding. Nothing further is needed.

## 2017-04-08 ENCOUNTER — Telehealth: Payer: Self-pay | Admitting: Internal Medicine

## 2017-04-08 NOTE — Progress Notes (Signed)
ATC, NA and no option to leave msg 

## 2017-04-08 NOTE — Progress Notes (Signed)
LMTCB

## 2017-04-08 NOTE — Telephone Encounter (Signed)
Notes recorded by Nyoka CowdenWert, Michael B, MD on 04/03/2017 at 5:13 PM EST Call patient : Studies are unremarkable, no change in recs  ATC pt, no answer. Left message for pt to call back.

## 2017-04-09 ENCOUNTER — Encounter: Payer: Self-pay | Admitting: *Deleted

## 2017-04-09 LAB — ALPHA-1-ANTITRYPSIN: A1 ANTITRYPSIN SER: 175 mg/dL (ref 83–199)

## 2017-04-09 LAB — ALPHA-1 ANTITRYPSIN PHENOTYPE: A1 ANTITRYPSIN SER: 178 mg/dL (ref 83–199)

## 2017-04-09 NOTE — Telephone Encounter (Signed)
Pt is aware of results and nothing further needed at this time.

## 2017-05-01 ENCOUNTER — Ambulatory Visit: Payer: BLUE CROSS/BLUE SHIELD | Admitting: Internal Medicine

## 2017-10-24 ENCOUNTER — Ambulatory Visit (INDEPENDENT_AMBULATORY_CARE_PROVIDER_SITE_OTHER): Payer: BLUE CROSS/BLUE SHIELD | Admitting: Emergency Medicine

## 2017-10-24 ENCOUNTER — Encounter: Payer: Self-pay | Admitting: Emergency Medicine

## 2017-10-24 ENCOUNTER — Other Ambulatory Visit: Payer: Self-pay

## 2017-10-24 VITALS — BP 124/72 | HR 76 | Temp 98.3°F | Resp 16 | Ht 71.0 in | Wt 161.2 lb

## 2017-10-24 DIAGNOSIS — R42 Dizziness and giddiness: Secondary | ICD-10-CM

## 2017-10-24 DIAGNOSIS — R51 Headache: Secondary | ICD-10-CM

## 2017-10-24 DIAGNOSIS — J449 Chronic obstructive pulmonary disease, unspecified: Secondary | ICD-10-CM

## 2017-10-24 DIAGNOSIS — R519 Headache, unspecified: Secondary | ICD-10-CM | POA: Insufficient documentation

## 2017-10-24 DIAGNOSIS — R5381 Other malaise: Secondary | ICD-10-CM

## 2017-10-24 DIAGNOSIS — F418 Other specified anxiety disorders: Secondary | ICD-10-CM | POA: Diagnosis not present

## 2017-10-24 DIAGNOSIS — F17200 Nicotine dependence, unspecified, uncomplicated: Secondary | ICD-10-CM

## 2017-10-24 DIAGNOSIS — R5383 Other fatigue: Secondary | ICD-10-CM

## 2017-10-24 MED ORDER — DULOXETINE HCL 30 MG PO CPEP: 30.0000 mg | ORAL_CAPSULE | Freq: Every day | ORAL | 3 refills | Status: DC

## 2017-10-24 NOTE — Patient Instructions (Addendum)
   IF you received an x-ray today, you will receive an invoice from [facility]. Please contact [facility] at [phone] with questions or concerns regarding your invoice.   IF you received labwork today, you will receive an invoice from [facility]. Please contact [facility] at [phone] with questions or concerns regarding your invoice.   Our billing staff will not be able to assist you with questions regarding bills from these companies.  You will be contacted with the lab results as soon as they are available. The fastest way to get your results is to activate your My Chart account. Instructions are located on the last page of this paperwork. If you have not heard from us regarding the results in 2 weeks, please contact this office.    Dizziness Dizziness is a common problem. It makes you feel unsteady or light-headed. You may feel like you are about to pass out (faint). Dizziness can lead to getting hurt if you stumble or fall. Dizziness can be caused by many things, including:  Medicines.  Not having enough water in your body (dehydration).  Illness.  Follow these instructions at home: Eating and drinking  Drink enough fluid to keep your pee (urine) clear or pale yellow. This helps to keep you from getting dehydrated. Try to drink more clear fluids, such as water.  Do not drink alcohol.  Limit how much caffeine you drink or eat, if your doctor tells you to do that.  Limit how much salt (sodium) you drink or eat, if your doctor tells you to do that. Activity  Avoid making quick movements. ? When you stand up from sitting in a chair, steady yourself until you feel okay. ? In the morning, first sit up on the side of the bed. When you feel okay, stand slowly while you hold onto something. Do this until you know that your balance is fine.  If you need to stand in one place for a long time, move your legs often. Tighten and relax the muscles in your legs  while you are standing.  Do not drive or use heavy machinery if you feel dizzy.  Avoid bending down if you feel dizzy. Place items in your home so you can reach them easily without leaning over. Lifestyle  Do not use any products that contain nicotine or tobacco, such as cigarettes and e-cigarettes. If you need help quitting, ask your doctor.  Try to lower your stress level. You can do this by using methods such as yoga or meditation. Talk with your doctor if you need help. General instructions  Watch your dizziness for any changes.  Take over-the-counter and prescription medicines only as told by your doctor. Talk with your doctor if you think that you are dizzy because of a medicine that you are taking.  Tell a friend or a family member that you are feeling dizzy. If he or she notices any changes in your behavior, have this person call your doctor.  Keep all follow-up visits as told by your doctor. This is important. Contact a doctor if:  Your dizziness does not go away.  Your dizziness or light-headedness gets worse.  You feel sick to your stomach (nauseous).  You have trouble hearing.  You have new symptoms.  You are unsteady on your feet.  You feel like the room is spinning. Get help right away if:  You throw up (vomit) or have watery poop (diarrhea), and you cannot eat or drink anything.  You have trouble: ?   Talking. ? Walking. ? Swallowing. ? Using your arms, hands, or legs.  You feel generally weak.  You are not thinking clearly, or you have trouble forming sentences. A friend or family member may notice this.  You have: ? Chest pain. ? Pain in your belly (abdomen). ? Shortness of breath. ? Sweating.  Your vision changes.  You are bleeding.  You have a very bad headache.  You have neck pain or a stiff neck.  You have a fever. These symptoms may be an emergency. Do not wait to see if the symptoms will go away. Get medical help right away. Call  your local emergency services (911 in the [place] Do not drive yourself to the hospital. Summary  Dizziness makes you feel unsteady or light-headed. You may feel like you are about to pass out (faint).  Drink enough fluid to keep your pee (urine) clear or pale yellow. Do not drink alcohol.  Avoid making quick movements if you feel dizzy.  Watch your dizziness for any changes. This information is not intended to replace advice given to you by your health care provider. Make sure you discuss any questions you have with your health care provider. Document Released: 04/25/2011 Document Revised: 05/23/2016 Document Reviewed: 05/23/2016 Elsevier Interactive Patient Education  2017 Elsevier Inc.  

## 2017-10-24 NOTE — Progress Notes (Addendum)
Anthony Mayo 62 y.o.   Chief Complaint  Patient presents with  . Dizziness    started on 10/20/2017 per patient been having blood pressure issues  . Headache  . Depression    triage score 9    HISTORY OF PRESENT ILLNESS: This is a 62 y.o. male complaining of multiple symptoms for several months.  Complaining of recurrent episodes of dizziness, headaches, ears ringing, not sleeping well, labored breathing, has had a very stressful job for the last 3 years and thinks that the pressure at work is contributing to his symptoms.  Chronic smoker with a history of COPD.  Not ready to quit smoking yet.  Drinks beer on the weekends but no EtOH consumption during the week.  No other significant symptoms.  HPI   Prior to Admission medications   Medication Sig Start Date End Date Taking? Authorizing Provider  albuterol (PROVENTIL HFA;VENTOLIN HFA) 108 (90 Base) MCG/ACT inhaler Inhale 2 puffs every 6 (six) hours as needed into the lungs. 04/01/17  Yes Creg Gilmer, Eilleen Kempf, MD  indomethacin (INDOCIN) 50 MG capsule Take 50 mg 3 times daily only when needed for flares of gout 04/03/17  Yes Nabeeha Badertscher, Eilleen Kempf, MD  MELATONIN GUMMIES PO Take at bedtime by mouth.   Yes [provider]  mometasone-formoterol (DULERA) 100-5 MCG/ACT AERO INHALE 2 PUFFS INTO THE LUNGS 2 (TWO) TIMES DAILY 04/03/17  Yes Nyoka Cowden, MD  Tiotropium Bromide Monohydrate (SPIRIVA RESPIMAT) 2.5 MCG/ACT AERS Inhale 2 puffs daily into the lungs. Patient not taking: Reported on 10/24/2017 04/03/17   Nyoka Cowden, MD    No Known Allergies  Patient Active Problem List   Diagnosis Date Noted  . Cigarette smoker 04/04/2017  . Dyspnea on exertion 04/01/2017  . COPD   GOLD II/ still smoking 03/24/2012  . Asthma 03/19/2012    Past Medical History:  Diagnosis Date  . Asthma   . COPD (chronic obstructive pulmonary disease) (HCC)   . Gout     Past Surgical History:  Procedure Laterality Date  . REPLACEMENT TOTAL  KNEE  12/11/2010    Social History   Socioeconomic History  . Marital status: Married    Spouse name: Not on file  . Number of children: Not on file  . Years of education: Not on file  . Highest education level: Not on file  Occupational History  . Not on file  Social Needs  . Financial resource strain: Not on file  . Food insecurity:    Worry: Not on file    Inability: Not on file  . Transportation needs:    Medical: Not on file    Non-medical: Not on file  Tobacco Use  . Smoking status: Current Every Day Smoker    Packs/day: 2.00    Years: 47.00    Pack years: 94.00  . Smokeless tobacco: Current User    Types: Snuff  Substance and Sexual Activity  . Alcohol use: Yes    Alcohol/week: 14.4 oz    Types: 24 Cans of beer per week  . Drug use: No  . Sexual activity: Not on file  Lifestyle  . Physical activity:    Days per week: Not on file    Minutes per session: Not on file  . Stress: Not on file  Relationships  . Social connections:    Talks on phone: Not on file    Gets together: Not on file    Attends religious service: Not on file    Active  member of club or organization: Not on file    Attends meetings of clubs or organizations: Not on file    Relationship status: Not on file  . Intimate partner violence:    Fear of current or ex partner: Not on file    Emotionally abused: Not on file    Physically abused: Not on file    Forced sexual activity: Not on file  Other Topics Concern  . Not on file  Social History Narrative  . Not on file    Family History  Problem Relation Age of Onset  . Prostate cancer Father      Review of Systems  Constitutional: Positive for malaise/fatigue. Negative for chills and fever.  HENT: Negative.  Negative for congestion, hearing loss, nosebleeds and sore throat.   Eyes: Negative.  Negative for blurred vision, double vision, discharge and redness.  Respiratory: Positive for cough and shortness of breath.   Cardiovascular:  Negative.  Negative for chest pain, palpitations and leg swelling.  Gastrointestinal: Negative.  Negative for abdominal pain, nausea and vomiting.  Genitourinary: Negative.  Negative for dysuria.  Musculoskeletal: Negative.  Negative for myalgias.  Skin: Negative.  Negative for rash.  Neurological: Positive for dizziness and headaches. Negative for sensory change, focal weakness, seizures and loss of consciousness.  Endo/Heme/Allergies: Negative.   Psychiatric/Behavioral: Positive for depression. Negative for suicidal ideas. The patient is nervous/anxious.   All other systems reviewed and are negative.     Vitals:   10/24/17 1217  BP: 124/72  Pulse: 76  Resp: 16  Temp: 98.3 F (36.8 C)  SpO2: 98%    Physical Exam  Constitutional: He is oriented to person, place, and time. He appears well-developed and well-nourished.  HENT:  Head: Normocephalic and atraumatic.  Right Ear: External ear normal.  Left Ear: External ear normal.  Nose: Nose normal.  Mouth/Throat: Oropharynx is clear and moist.  Eyes: Pupils are equal, round, and reactive to light. Conjunctivae and EOM are normal.  Neck: Normal range of motion. Neck supple. No thyromegaly present.  Cardiovascular: Normal rate, regular rhythm and normal heart sounds.  Pulmonary/Chest: Effort normal and breath sounds normal.  Musculoskeletal: Normal range of motion. He exhibits no edema.  Lymphadenopathy:    He has no cervical adenopathy.  Neurological: He is alert and oriented to person, place, and time. No sensory deficit. He exhibits normal muscle tone.  Skin: Skin is warm and dry. Capillary refill takes less than 2 seconds.  Psychiatric: He has a normal mood and affect. His behavior is normal.     ASSESSMENT & PLAN: [name] was seen today for dizziness, headache and depression.  Diagnoses and all orders for this visit:  Dizziness -     CBC with Differential/Platelet -     Comprehensive metabolic panel -     Hemoglobin  A1c -     Hepatitis C antibody -     HIV antibody  Recurrent headache  Depression with anxiety -     Discontinue: DULoxetine (CYMBALTA) 30 MG capsule; Take 1 capsule (30 mg total) by mouth daily.  Chronic obstructive pulmonary disease, unspecified COPD type (HCC)  Smoker  Malaise and fatigue    Patient Instructions       IF you received an x-ray today, you will receive an invoice from [facility]. Please contact [facility] at [phone] with questions or concerns regarding your invoice.   IF you received labwork today, you will receive an invoice from [facility]. Please contact [facility] at [phone]  with questions or concerns regarding your invoice.   Our billing staff will not be able to assist you with questions regarding bills from these companies.  You will be contacted with the lab results as soon as they are available. The fastest way to get your results is to activate your My Chart account. Instructions are located on the last page of this paperwork. If you have not heard from [place] regarding the results in 2 weeks, please contact this office.    Dizziness Dizziness is a common problem. It makes you feel unsteady or light-headed. You may feel like you are about to pass out (faint). Dizziness can lead to getting hurt if you stumble or fall. Dizziness can be caused by many things, including:  Medicines.  Not having enough water in your body (dehydration).  Illness.  Follow these instructions at home: Eating and drinking  Drink enough fluid to keep your pee (urine) clear or pale yellow. This helps to keep you from getting dehydrated. Try to drink more clear fluids, such as water.  Do not drink alcohol.  Limit how much caffeine you drink or eat, if your doctor tells you to do that.  Limit how much salt (sodium) you drink or eat, if your doctor tells you to do that. Activity  Avoid making quick movements. ? When you stand up from sitting in a  chair, steady yourself until you feel okay. ? In the morning, first sit up on the side of the bed. When you feel okay, stand slowly while you hold onto something. Do this until you know that your balance is fine.  If you need to stand in one place for a long time, move your legs often. Tighten and relax the muscles in your legs while you are standing.  Do not drive or use heavy machinery if you feel dizzy.  Avoid bending down if you feel dizzy. Place items in your home so you can reach them easily without leaning over. Lifestyle  Do not use any products that contain nicotine or tobacco, such as cigarettes and e-cigarettes. If you need help quitting, ask your doctor.  Try to lower your stress level. You can do this by using methods such as yoga or meditation. Talk with your doctor if you need help. General instructions  Watch your dizziness for any changes.  Take over-the-counter and prescription medicines only as told by your doctor. Talk with your doctor if you think that you are dizzy because of a medicine that you are taking.  Tell a friend or a family member that you are feeling dizzy. If he or she notices any changes in your behavior, have this person call your doctor.  Keep all follow-up visits as told by your doctor. This is important. Contact a doctor if:  Your dizziness does not go away.  Your dizziness or light-headedness gets worse.  You feel sick to your stomach (nauseous).  You have trouble hearing.  You have new symptoms.  You are unsteady on your feet.  You feel like the room is spinning. Get help right away if:  You throw up (vomit) or have watery poop (diarrhea), and you cannot eat or drink anything.  You have trouble: ? Talking. ? Walking. ? Swallowing. ? Using your arms, hands, or legs.  You feel generally weak.  You are not thinking clearly, or you have trouble forming sentences. A friend or family member may notice this.  You have: ? Chest  pain. ? Pain in your  belly (abdomen). ? Shortness of breath. ? Sweating.  Your vision changes.  You are bleeding.  You have a very bad headache.  You have neck pain or a stiff neck.  You have a fever. These symptoms may be an emergency. Do not wait to see if the symptoms will go away. Get medical help right away. Call your local emergency services (911 in the [place] Do not drive yourself to the hospital. Summary  Dizziness makes you feel unsteady or light-headed. You may feel like you are about to pass out (faint).  Drink enough fluid to keep your pee (urine) clear or pale yellow. Do not drink alcohol.  Avoid making quick movements if you feel dizzy.  Watch your dizziness for any changes. This information is not intended to replace advice given to you by your health care provider. Make sure you discuss any questions you have with your health care provider. Document Released: 04/25/2011 Document Revised: 05/23/2016 Document Reviewed: 05/23/2016 Elsevier Interactive Patient Education  2017 Elsevier Inc.      Edwina BarthMiguel Theoden Mauch, MD [facility]

## 2017-10-25 LAB — CBC WITH DIFFERENTIAL/PLATELET
BASOS ABS: 0 10*3/uL (ref 0.0–0.2)
Basos: 0 %
EOS (ABSOLUTE): 0.2 10*3/uL (ref 0.0–0.4)
Eos: 2 %
Hematocrit: 42.5 % (ref 37.5–51.0)
Hemoglobin: 14.4 g/dL (ref 13.0–17.7)
IMMATURE GRANS (ABS): 0.1 10*3/uL (ref 0.0–0.1)
Immature Granulocytes: 1 %
Lymphocytes Absolute: 1.9 10*3/uL (ref 0.7–3.1)
Lymphs: 20 %
MCH: 32.4 pg (ref 26.6–33.0)
MCHC: 33.9 g/dL (ref 31.5–35.7)
MCV: 96 fL (ref 79–97)
Monocytes Absolute: 0.6 10*3/uL (ref 0.1–0.9)
Monocytes: 6 %
NEUTROS PCT: 71 %
Neutrophils Absolute: 6.7 10*3/uL (ref 1.4–7.0)
PLATELETS: 269 10*3/uL (ref 150–450)
RBC: 4.45 x10E6/uL (ref 4.14–5.80)
RDW: 13.7 % (ref 12.3–15.4)
WBC: 9.4 10*3/uL (ref 3.4–10.8)

## 2017-10-25 LAB — COMPREHENSIVE METABOLIC PANEL
ALT: 20 IU/L (ref 0–44)
AST: 24 IU/L (ref 0–40)
Albumin/Globulin Ratio: 1.6 (ref 1.2–2.2)
Albumin: 4 g/dL (ref 3.6–4.8)
Alkaline Phosphatase: 50 IU/L (ref 39–117)
BUN/Creatinine Ratio: 18 (ref 10–24)
BUN: 16 mg/dL (ref 8–27)
Bilirubin Total: 0.3 mg/dL (ref 0.0–1.2)
CO2: 22 mmol/L (ref 20–29)
Calcium: 9.1 mg/dL (ref 8.6–10.2)
Chloride: 103 mmol/L (ref 96–106)
Creatinine, Ser: 0.87 mg/dL (ref 0.76–1.27)
GFR calc Af Amer: 107 mL/min/{1.73_m2} (ref >59)
GFR calc non Af Amer: 92 mL/min/{1.73_m2} (ref >59)
Globulin, Total: 2.5 g/dL (ref 1.5–4.5)
Glucose: 98 mg/dL (ref 65–99)
Potassium: 4.3 mmol/L (ref 3.5–5.2)
Sodium: 142 mmol/L (ref 134–144)
Total Protein: 6.5 g/dL (ref 6.0–8.5)

## 2017-10-25 LAB — HEMOGLOBIN A1C
Est. average glucose Bld gHb Est-mCnc: 111 mg/dL
HEMOGLOBIN A1C: 5.5 % (ref 4.8–5.6)

## 2017-10-25 LAB — HEPATITIS C ANTIBODY

## 2017-10-25 LAB — HIV ANTIBODY (ROUTINE TESTING W REFLEX): HIV Screen 4th Generation wRfx: NONREACTIVE

## 2017-10-27 ENCOUNTER — Other Ambulatory Visit: Payer: Self-pay | Admitting: Emergency Medicine

## 2017-10-27 DIAGNOSIS — R768 Other specified abnormal immunological findings in serum: Secondary | ICD-10-CM

## 2017-11-08 ENCOUNTER — Other Ambulatory Visit: Payer: Self-pay

## 2017-11-08 ENCOUNTER — Encounter: Payer: Self-pay | Admitting: Emergency Medicine

## 2017-11-08 ENCOUNTER — Ambulatory Visit (INDEPENDENT_AMBULATORY_CARE_PROVIDER_SITE_OTHER): Payer: BLUE CROSS/BLUE SHIELD | Admitting: Emergency Medicine

## 2017-11-08 VITALS — BP 135/72 | HR 90 | Temp 98.2°F | Resp 16 | Ht 71.0 in | Wt 161.8 lb

## 2017-11-08 DIAGNOSIS — B182 Chronic viral hepatitis C: Secondary | ICD-10-CM | POA: Insufficient documentation

## 2017-11-08 DIAGNOSIS — R768 Other specified abnormal immunological findings in serum: Secondary | ICD-10-CM | POA: Diagnosis not present

## 2017-11-08 DIAGNOSIS — F418 Other specified anxiety disorders: Secondary | ICD-10-CM | POA: Diagnosis not present

## 2017-11-08 MED ORDER — DULOXETINE HCL 30 MG PO CPEP: 30.0000 mg | ORAL_CAPSULE | Freq: Two times a day (BID) | ORAL | 3 refills | Status: DC

## 2017-11-08 NOTE — Patient Instructions (Addendum)
     IF you received an x-ray today, you will receive an invoice from [facility]. Please contact [facility] at [phone] with questions or concerns regarding your invoice.   IF you received labwork today, you will receive an invoice from [facility]. Please contact [facility] at [phone] with questions or concerns regarding your invoice.   Our billing staff will not be able to assist you with questions regarding bills from these companies.  You will be contacted with the lab results as soon as they are available. The fastest way to get your results is to activate your My Chart account. Instructions are located on the last page of this paperwork. If you have not heard from us regarding the results in 2 weeks, please contact this office.      Hepatitis C Hepatitis C is a liver infection. It is caused by a germ that can spread through blood and other bodily fluids. Your doctor will use blood and liver tests to:  Check for this infection.  Decide how to treat you.  Check your health after treatment.  Follow these instructions at home:  Rest.  Do not take any medicine unless your doctor says it is okay. This includes over-the-counter medicine and birth control pills.  Do not drink alcohol.  Do not have sex until your doctor says it is okay.  Do not share toothbrushes, nail clippers, razors, or needles.  Take all medicines as told by your doctor. Contact a doctor if:  You have a fever.  Your belly (abdomen) hurts.  Your pee (urine) is dark.  Your poop (bowel movement) is the color of clay.  You have joint pain. Get help right away if:  You feel more and more tired (fatigued).  You feel more and more weak.  You do not feel like eating.  You feel sick to your stomach (nauseous) or throw up (vomit).  Your skin or the whites of your eyes turn yellow (jaundice) or turn more yellow than they were before.  You bruise or bleed easily. This  information is not intended to replace advice given to you by your health care provider. Make sure you discuss any questions you have with your health care provider. Document Released: 04/18/2008 Document Revised: 10/12/2015 Document Reviewed: 08/18/2013 Elsevier Interactive Patient Education  2017 [employer]

## 2017-11-08 NOTE — Assessment & Plan Note (Signed)
Clinically stable and tolerating medication well.  However not feeling much effect yet.  Will increase dose of Cymbalta to 30 mg twice a day.  Follow-up in 4 weeks.

## 2017-11-08 NOTE — Progress Notes (Signed)
Anthony Mayo 62 y.o.   Chief Complaint  Patient presents with  . Depression    follow up on Cymbalta    HISTORY OF PRESENT ILLNESS: This is a 62 y.o. male seen by me on 10/24/2017 with dizziness, depression, anxiety.  Started on Cymbalta 30 mg daily.  Blood work showed positive hepatitis C antibody.  Refer to infectious diseases for further evaluation and treatment.  No new complaints today.  However does not feel full effect of Cymbalta yet.  No side effects.  Chronic stress situation at work has not changed.  HPI   Prior to Admission medications   Medication Sig Start Date End Date Taking? Authorizing Provider  albuterol (PROVENTIL HFA;VENTOLIN HFA) 108 (90 Base) MCG/ACT inhaler Inhale 2 puffs every 6 (six) hours as needed into the lungs. 04/01/17  Yes Dyer Klug, Eilleen Kempf, MD  DULoxetine (CYMBALTA) 30 MG capsule Take 1 capsule (30 mg total) by mouth daily. 10/24/17 11/23/17 Yes Jaylynn Siefert, Eilleen Kempf, MD  indomethacin (INDOCIN) 50 MG capsule Take 50 mg 3 times daily only when needed for flares of gout 04/03/17  Yes Nanci Lakatos, Eilleen Kempf, MD  MELATONIN GUMMIES PO Take at bedtime by mouth.   Yes [provider]  mometasone-formoterol (DULERA) 100-5 MCG/ACT AERO INHALE 2 PUFFS INTO THE LUNGS 2 (TWO) TIMES DAILY 04/03/17  Yes Nyoka Cowden, MD  Tiotropium Bromide Monohydrate (SPIRIVA RESPIMAT) 2.5 MCG/ACT AERS Inhale 2 puffs daily into the lungs. Patient not taking: Reported on 10/24/2017 04/03/17   Nyoka Cowden, MD    No Known Allergies  Patient Active Problem List   Diagnosis Date Noted  . Dizziness 10/24/2017  . Recurrent headache 10/24/2017  . Depression with anxiety 10/24/2017  . Cigarette smoker 04/04/2017  . Dyspnea on exertion 04/01/2017  . COPD   GOLD II/ still smoking 03/24/2012  . Asthma 03/19/2012    Past Medical History:  Diagnosis Date  . Asthma   . COPD (chronic obstructive pulmonary disease) (HCC)   . Gout     Past Surgical History:  Procedure  Laterality Date  . REPLACEMENT TOTAL KNEE  12/11/2010    Social History   Socioeconomic History  . Marital status: Married    Spouse name: Not on file  . Number of children: Not on file  . Years of education: Not on file  . Highest education level: Not on file  Occupational History  . Not on file  Social Needs  . Financial resource strain: Not on file  . Food insecurity:    Worry: Not on file    Inability: Not on file  . Transportation needs:    Medical: Not on file    Non-medical: Not on file  Tobacco Use  . Smoking status: Current Every Day Smoker    Packs/day: 2.00    Years: 47.00    Pack years: 94.00  . Smokeless tobacco: Current User    Types: Snuff  Substance and Sexual Activity  . Alcohol use: Yes    Alcohol/week: 14.4 oz    Types: 24 Cans of beer per week  . Drug use: No  . Sexual activity: Not on file  Lifestyle  . Physical activity:    Days per week: Not on file    Minutes per session: Not on file  . Stress: Not on file  Relationships  . Social connections:    Talks on phone: Not on file    Gets together: Not on file    Attends religious service: Not on file  Active member of club or organization: Not on file    Attends meetings of clubs or organizations: Not on file    Relationship status: Not on file  . Intimate partner violence:    Fear of current or ex partner: Not on file    Emotionally abused: Not on file    Physically abused: Not on file    Forced sexual activity: Not on file  Other Topics Concern  . Not on file  Social History Narrative  . Not on file    Family History  Problem Relation Age of Onset  . Prostate cancer Father      Review of Systems  Constitutional: Negative.  Negative for chills and fever.  HENT: Negative.  Negative for hearing loss and sore throat.   Eyes: Negative.  Negative for blurred vision and double vision.  Respiratory: Negative.  Negative for cough, hemoptysis and shortness of breath.   Cardiovascular:  Negative.  Negative for chest pain and palpitations.  Gastrointestinal: Negative.  Negative for abdominal pain, nausea and vomiting.  Genitourinary: Negative.  Negative for dysuria and hematuria.  Skin: Negative.  Negative for rash.  Neurological: Negative.   Endo/Heme/Allergies: Negative.   All other systems reviewed and are negative.   Vitals:   11/08/17 1056  BP: 135/72  Pulse: 90  Resp: 16  Temp: 98.2 F (36.8 C)  SpO2: 94%    Physical Exam  Constitutional: He is oriented to person, place, and time. He appears well-developed and well-nourished.  HENT:  Head: Normocephalic.  Eyes: Pupils are equal, round, and reactive to light. EOM are normal.  Neck: Normal range of motion. Neck supple.  Cardiovascular: Normal rate and regular rhythm.  Pulmonary/Chest: Effort normal.  Musculoskeletal: Normal range of motion.  Neurological: He is alert and oriented to person, place, and time.  Skin: Skin is warm and dry. Capillary refill takes less than 2 seconds.  Psychiatric: He has a normal mood and affect. His behavior is normal.  Vitals reviewed.  A total of 25 minutes was spent in the room with the patient, greater than 50% of which was in counseling/coordination of care regarding diagnosis, medication, treatment options, and need for follow-up.   Depression with anxiety Clinically stable and tolerating medication well.  However not feeling much effect yet.  Will increase dose of Cymbalta to 30 mg twice a day.  Follow-up in 4 weeks.  Hepatitis C antibody positive in blood Was referred to infectious diseases.  Got a call but has not scheduled appointment yet.  Wanted to discuss findings with me first.    ASSESSMENT & PLAN: Anthony Mayo was seen today for depression.  Diagnoses and all orders for this visit:  Depression with anxiety -     DULoxetine (CYMBALTA) 30 MG capsule; Take 1 capsule (30 mg total) by mouth 2 (two) times daily.  Hepatitis C antibody positive in blood       Patient Instructions       IF you received an x-ray today, you will receive an invoice from [facility]. Please contact [facility] at [phone] with questions or concerns regarding your invoice.   IF you received labwork today, you will receive an invoice from [facility]. Please contact [facility] at [phone] with questions or concerns regarding your invoice.   Our billing staff will not be able to assist you with questions regarding bills from these companies.  You will be contacted with the lab results as soon as they are available. The fastest way to get your  results is to activate your My Chart account. Instructions are located on the last page of this paperwork. If you have not heard from [place] regarding the results in 2 weeks, please contact this office.      Hepatitis C Hepatitis C is a liver infection. It is caused by a germ that can spread through blood and other bodily fluids. Your doctor will use blood and liver tests to:  Check for this infection.  Decide how to treat you.  Check your health after treatment.  Follow these instructions at home:  Rest.  Do not take any medicine unless your doctor says it is okay. This includes over-the-counter medicine and birth control pills.  Do not drink alcohol.  Do not have sex until your doctor says it is okay.  Do not share toothbrushes, nail clippers, razors, or needles.  Take all medicines as told by your doctor. Contact a doctor if:  You have a fever.  Your belly (abdomen) hurts.  Your pee (urine) is dark.  Your poop (bowel movement) is the color of clay.  You have joint pain. Get help right away if:  You feel more and more tired (fatigued).  You feel more and more weak.  You do not feel like eating.  You feel sick to your stomach (nauseous) or throw up (vomit).  Your skin or the whites of your eyes turn yellow (jaundice) or turn more yellow than they were before.  You bruise or  bleed easily. This information is not intended to replace advice given to you by your health care provider. Make sure you discuss any questions you have with your health care provider. Document Released: 04/18/2008 Document Revised: 10/12/2015 Document Reviewed: 08/18/2013 Elsevier Interactive Patient Education  2017 Elsevier Inc.      Edwina Barth, MD [facility]

## 2017-11-08 NOTE — Assessment & Plan Note (Signed)
Was referred to infectious diseases.  Got a call but has not scheduled appointment yet.  Wanted to discuss findings with me first.

## 2017-12-01 ENCOUNTER — Ambulatory Visit (INDEPENDENT_AMBULATORY_CARE_PROVIDER_SITE_OTHER): Payer: BLUE CROSS/BLUE SHIELD | Admitting: Internal Medicine

## 2017-12-01 ENCOUNTER — Encounter: Payer: Self-pay | Admitting: Internal Medicine

## 2017-12-01 ENCOUNTER — Ambulatory Visit (INDEPENDENT_AMBULATORY_CARE_PROVIDER_SITE_OTHER): Payer: BLUE CROSS/BLUE SHIELD | Admitting: Licensed Clinical Social Worker

## 2017-12-01 VITALS — BP 153/71 | HR 74 | Temp 98.0°F | Ht 72.0 in | Wt 164.0 lb

## 2017-12-01 DIAGNOSIS — R768 Other specified abnormal immunological findings in serum: Secondary | ICD-10-CM | POA: Diagnosis not present

## 2017-12-01 DIAGNOSIS — F419 Anxiety disorder, unspecified: Secondary | ICD-10-CM | POA: Diagnosis not present

## 2017-12-01 DIAGNOSIS — Z0189 Encounter for other specified special examinations: Secondary | ICD-10-CM | POA: Diagnosis not present

## 2017-12-01 DIAGNOSIS — F1010 Alcohol abuse, uncomplicated: Secondary | ICD-10-CM

## 2017-12-01 NOTE — Progress Notes (Signed)
[facility]      Reason for Consult: hepatitis C Ab positive    Referring Physician: Dr. Alvy BimlerSagardia    Patient ID: Anthony Mayo, male    DOB: 05/11/1956, 62 y.o.   MRN: [id number]  HPI:   He recently had a positive Ab test by his PCP during routine screening.  He does endorse a remote history of IVDU in the 1970s and does recall being told he had nonA, nonB hepatitis in the 1980s while trying to donate blood but otherwise never evaluated again.  He had a positive Ab test done but no confirmatory test yet.  He has no known liver disease, he is HIV negative, normal platelets and normal LFTs.  No previous liver imaging done.   Past Medical History:  Diagnosis Date  . Asthma   . COPD (chronic obstructive pulmonary disease) (HCC)   . Gout     Prior to Admission medications   Medication Sig Start Date End Date Taking? Authorizing Provider  albuterol (PROVENTIL HFA;VENTOLIN HFA) 108 (90 Base) MCG/ACT inhaler Inhale 2 puffs every 6 (six) hours as needed into the lungs. 04/01/17  Yes Sagardia, Eilleen KempfMiguel Jose, MD  indomethacin (INDOCIN) 50 MG capsule Take 50 mg 3 times daily only when needed for flares of gout 04/03/17  Yes Sagardia, Eilleen KempfMiguel Jose, MD  MELATONIN GUMMIES PO Take at bedtime by mouth.   Yes [provider]  mometasone-formoterol (DULERA) 100-5 MCG/ACT AERO INHALE 2 PUFFS INTO THE LUNGS 2 (TWO) TIMES DAILY 04/03/17  Yes Nyoka CowdenWert, Michael B, MD  Tiotropium Bromide Monohydrate (SPIRIVA RESPIMAT) 2.5 MCG/ACT AERS Inhale 2 puffs daily into the lungs. 04/03/17  Yes Nyoka CowdenWert, Michael B, MD  DULoxetine (CYMBALTA) 30 MG capsule Take 1 capsule (30 mg total) by mouth 2 (two) times daily. Patient not taking: Reported on 12/01/2017 11/08/17 12/08/17  Georgina QuintSagardia, Miguel Jose, MD    No Known Allergies  Social History   Tobacco Use  . Smoking status: Current Every Day Smoker    Packs/day: 1.00    Years: 47.00    Pack years: 47.00  . Smokeless tobacco: Current User   Types: Snuff  Substance Use Topics  . Alcohol use: Yes    Alcohol/week: 14.4 oz    Types: 24 Cans of beer per week  . Drug use: No    Family History  Problem Relation Age of Onset  . Prostate cancer Father     Review of Systems  Constitutional: negative for malaise, anorexia and weight loss Gastrointestinal: negative for nausea and diarrhea Integument/breast: negative for rash All other systems reviewed and are negative    Constitutional: in no apparent distress and alert  Vitals:   12/01/17 1514  BP: (!) 153/71  Pulse: 74  Temp: 98 F (36.7 C)   EYES: anicteric ENMT: no thrush Cardiovascular: Cor RRR Respiratory: CTA B; normal respiratory effort GI: Bowel sounds are normal, liver is not enlarged, spleen is not enlarged Musculoskeletal: no pedal edema noted Skin: negatives: no rash Hematologic: no cervical lad  Labs: Lab Results  Component Value Date   WBC 9.4 10/24/2017   HGB 14.4 10/24/2017   HCT 42.5 10/24/2017   MCV 96 10/24/2017   PLT 269 10/24/2017    Lab Results  Component Value Date   CREATININE 0.87 10/24/2017   BUN 16 10/24/2017   NA 142 10/24/2017   K 4.3 10/24/2017   CL 103 10/24/2017   CO2 22 10/24/2017    Lab Results  Component Value Date  ALT 20 10/24/2017   AST 24 10/24/2017   ALKPHOS 50 10/24/2017   BILITOT 0.3 10/24/2017   INR 1.91 (H) 12/14/2010     Assessment: Hepatitis C Ab positive.  I first discussed with him hepatitis C and that 20% of patients exposed have spontaneous resolution during the first year of exposure.  I discussed that confirmation of active disease in the 80% is with an RNA test.   I also discussed treatment options, reasons for treatment including limiting liver damage, reduction in risk for liver cancer and medication options.  At this time, will confirm positivity and if positive, he will complete the workup with further labs and imaging.   Plan: 1) HCV RNA Return for labs if postiive and elastography will  be scheduled.

## 2017-12-01 NOTE — BH Specialist Note (Signed)
Integrated Behavioral Health Initial Visit  MRN: [id number] Name: Anthony Mayo  Number of Integrated Behavioral Health Clinician visits:: 1/6 Session Start time: 4:01pm  Session End time: 4:17pm Total time: 15 minutes  Type of Service: Integrated Behavioral Health- Individual/Family Interpretor:No. Interpretor Name and Language: n/a    Warm Hand Off Completed.       SUBJECTIVE: Anthony KingfisherHarry Mayo is a [age] y.o. male accompanied by Spouse Patient was referred by Dr. Luciana Axeomer for anxiety and depression. Patient reports the following symptoms/concerns: stress, worrying all the time, "acting ill" and irritable, feeling hopeless, increased drinking, weight loss and difficulty eating and sleeping Duration of problem: unknown; Severity of problem: moderate  OBJECTIVE: Mood: Anxious and Affect: tense Risk of harm to self or others: No plan to harm self or others  LIFE CONTEXT: Patient lives with his wife and works full time as a Production designer, theatre/television/filmmanager in a factory. He reports that his job is his major stressor, as he has 15 employees under him who he believes have little work [employer] and has trouble motivating and keeping on task. Patient states that he has been drinking more and more often and that his mood has become very irritable. He had been taking Celexa for anxiety and depression but after 3 weeks it wasn't working the way he expected and was making him feel worse, so he discontinued it. Patient has an appointment for med management on 7/26, but thinks he will go back before that to discuss meds.   GOALS ADDRESSED: Patient will: 1. Reduce symptoms of: anxiety   INTERVENTIONS: Interventions utilized: Motivational Interviewing and Supportive Counseling    ASSESSMENT: Patient currently experiencing anxious thoughts he cannt get rid of, irritability, lack of motivation and energy, changes in sleep and in appetite, trouble focusing, hopelessness, and overall stress. He states that he has been drinking more to  alleviate these symptoms. Patient and counselor explored the dangers of drinking with gout and hepatitis. Patient reports he knows these things, but needs a way to relieve stress. He had hoped that the Celexa would help with this, but it did not. Counselor encouraged patient to inform med doctor that he had stopped the medication so that he does not go too long without medication. Patient processed some of his stressors at work, and wife identified how she sees stress and anxiety in him. She indicated that he talks about work a lot, even though they have an agreement not to discuss those things at home, he is irritable and snappy, and that he seems to be waiting until the end of the day Thursday to start drinking (since he does not have to work on Fridays). Wife reported that she believes patient's depression is related to being unable to "solve" his work issues and the fact that he has so little motivation after working that he does nothing around the house and then beats himself up for it. Patient agreed with this statement. He reports that he would like to enter counseling, but wants to do so after meeting with his meds doctor again. Counselor provided patient with her card and encouraged him to call when he decides to schedule    Patient may benefit from ongoing cognitive behavioral therapy.  PLAN: Patient will report med cessation to doctor and call to schedule appointment after seeing doctor for medication change.  Anthony Mayo Anthony Degroote, LCSW

## 2017-12-03 ENCOUNTER — Other Ambulatory Visit: Payer: Self-pay | Admitting: Internal Medicine

## 2017-12-03 DIAGNOSIS — B182 Chronic viral hepatitis C: Secondary | ICD-10-CM

## 2017-12-03 LAB — HEPATITIS C RNA QUANTITATIVE
HCV QUANT LOG: 7.13 {Log_IU}/mL — AB
HCV RNA, PCR, QN: 13500000 [IU]/mL — AB

## 2017-12-04 LAB — HEPATITIS C GENOTYPE

## 2017-12-05 ENCOUNTER — Telehealth: Payer: Self-pay | Admitting: *Deleted

## 2017-12-05 ENCOUNTER — Other Ambulatory Visit: Payer: Self-pay | Admitting: Emergency Medicine

## 2017-12-05 DIAGNOSIS — M109 Gout, unspecified: Secondary | ICD-10-CM

## 2017-12-05 NOTE — Telephone Encounter (Signed)
indocin refill Last Refill:04/03/17 # 90 1 RF Last OV: 04/18/14 (has upcoming appt 12/12/17) PCP: Dr Alvy BimlerSagardia Pharmacy:[facility] [address]

## 2017-12-05 NOTE — Telephone Encounter (Signed)
Relayed results, next steps. Patient would like to have [facility] a [facility].  [facility]/26 at 8:00, arrive 7:45 with nothing to eat or drink after midnight. He will come to [facility] after for labs, will be able to go to his primary care appointment after.   Patient notified and accepted. Andree CossHowell, Anthony Mayo M, RN

## 2017-12-05 NOTE — Telephone Encounter (Signed)
-----   Message from Gardiner Barefootobert W Comer, MD sent at 12/03/2017  9:52 AM EDT ----- Could you let him know that his lab confirmed that he has active chronic hepatitis C which will require treatment.  He should come back for labs and get scheduled for an elastography. Orders have been placed.   He can follow up with Judeth CornfieldStephanie or Tammy SoursGreg after that for the results and discussion of the medication. thanks

## 2017-12-12 ENCOUNTER — Ambulatory Visit (HOSPITAL_COMMUNITY)
Admission: RE | Admit: 2017-12-12 | Discharge: 2017-12-12 | Disposition: A | Discharge status: Home / Self Care | Payer: BLUE CROSS/BLUE SHIELD | Source: Ambulatory Visit | Attending: Internal Medicine | Admitting: Internal Medicine

## 2017-12-12 ENCOUNTER — Ambulatory Visit: Payer: BLUE CROSS/BLUE SHIELD | Admitting: Emergency Medicine

## 2017-12-12 ENCOUNTER — Other Ambulatory Visit: Payer: BLUE CROSS/BLUE SHIELD

## 2017-12-12 DIAGNOSIS — B182 Chronic viral hepatitis C: Secondary | ICD-10-CM | POA: Insufficient documentation

## 2017-12-13 LAB — HEPATITIS B SURFACE ANTIBODY,QUALITATIVE: Hep B S Ab: NONREACTIVE

## 2017-12-13 LAB — HEPATITIS B SURFACE ANTIGEN: HEP B S AG: NONREACTIVE

## 2017-12-13 LAB — HEPATITIS B CORE ANTIBODY, TOTAL: Hep B Core Total Ab: REACTIVE — AB

## 2017-12-13 LAB — HEPATITIS A ANTIBODY, TOTAL: Hepatitis A AB,Total: NONREACTIVE

## 2017-12-15 ENCOUNTER — Other Ambulatory Visit: Payer: Self-pay | Admitting: Internal Medicine

## 2017-12-15 MED ORDER — LEDIPASVIR-SOFOSBUVIR 90-400 MG PO TABS
1.0000 | ORAL_TABLET | Freq: Every day | ORAL | 2 refills | Status: DC
Start: 2017-12-15 — End: 2017-12-22

## 2017-12-17 LAB — LIVER FIBROSIS, FIBROTEST-ACTITEST
ALPHA-2-MACROGLOBULIN: 281 mg/dL — AB (ref 106–279)
ALT: 17 U/L (ref 9–46)
Apolipoprotein A1: 162 mg/dL (ref 94–176)
BILIRUBIN: 0.3 mg/dL (ref 0.2–1.2)
FIBROSIS SCORE: 0.19
GGT: 19 U/L (ref 3–70)
Haptoglobin: 265 mg/dL — ABNORMAL HIGH (ref 43–212)
NECROINFLAMMAT ACT SCORE: 0.05
REFERENCE ID: 2573429

## 2017-12-22 ENCOUNTER — Other Ambulatory Visit: Payer: Self-pay | Admitting: Pharmacist

## 2017-12-22 DIAGNOSIS — B182 Chronic viral hepatitis C: Secondary | ICD-10-CM

## 2017-12-22 MED ORDER — LEDIPASVIR-SOFOSBUVIR 90-400 MG PO TABS: 1.0000 | ORAL_TABLET | Freq: Every day | ORAL | 2 refills | Status: DC

## 2017-12-22 NOTE — Progress Notes (Signed)
[facility] cannot fill. [facility] must [facility] at [facility]. [place]

## 2018-01-05 ENCOUNTER — Telehealth: Payer: Self-pay

## 2018-01-05 NOTE — Telephone Encounter (Signed)
Patient left a voicemail to speak to Dr. Ephriam Knucklesomer's nurse. Attempted to call patient back, however I was unable to reach him. Left a voicemail for patient to call office back if he still needs assistance. Lorenso CourierJose L Anahi [place], [place]

## 2018-01-06 NOTE — Telephone Encounter (Signed)
Patient called today to discuss next steps to begin medication for Hep C. Patient was scheduled for a Elastography on 7/26. Was told that someone in our office would call with results. Patient will be scheduled to follow up with pharmacy to begin medication. Lorenso CourierJose L Layla Kesling, [place]

## 2018-02-09 ENCOUNTER — Telehealth: Payer: Self-pay | Admitting: Pharmacy Technician

## 2018-02-09 NOTE — Telephone Encounter (Signed)
Mr. Anthony Mayo states he will call [facility] to get shipment set up for Harovni.  I encouraged him to call me back if there is a copay as I can help get it covered.

## 2018-02-09 NOTE — Telephone Encounter (Signed)
[facility] and I have left multiple messages about approval and shipment of Harvoni.  No answer, no return calls.  [facility] will try 1 time more, then cancel.

## 2018-02-11 ENCOUNTER — Encounter: Payer: Self-pay | Admitting: Pharmacy Technician

## 2018-02-11 ENCOUNTER — Ambulatory Visit (INDEPENDENT_AMBULATORY_CARE_PROVIDER_SITE_OTHER): Payer: BLUE CROSS/BLUE SHIELD | Admitting: Pharmacist

## 2018-02-11 DIAGNOSIS — B182 Chronic viral hepatitis C: Secondary | ICD-10-CM | POA: Diagnosis not present

## 2018-02-11 DIAGNOSIS — R110 Nausea: Secondary | ICD-10-CM

## 2018-02-11 DIAGNOSIS — Z23 Encounter for immunization: Secondary | ICD-10-CM

## 2018-02-11 MED ORDER — ONDANSETRON 4 MG PO TBDP: 4.0000 mg | ORAL_TABLET | Freq: Three times a day (TID) | ORAL | 2 refills | Status: DC | PRN

## 2018-02-11 NOTE — Progress Notes (Signed)
HPI: Anthony Mayo is a 62 y.o. male who presents to the [facility] pharmacy clinic for his 1 month HepC follow-up visit.  Medication: Harvoni x 12 weeks  Start Date: 01/10/18  Hepatitis C Genotype: 1a  Fibrosis Score: F2/F3  Hepatitis C RNA: 13.5 million (12/01/17)  Patient Active Problem List   Diagnosis Date Noted  . Chronic hepatitis C without hepatic coma (HCC) 11/08/2017  . Dizziness 10/24/2017  . Recurrent headache 10/24/2017  . Depression with anxiety 10/24/2017  . Cigarette smoker 04/04/2017  . Dyspnea on exertion 04/01/2017  . COPD   GOLD II/ still smoking 03/24/2012  . Asthma 03/19/2012    Patient's Medications  New Prescriptions   ONDANSETRON (ZOFRAN ODT) 4 MG DISINTEGRATING TABLET    Take 1 tablet (4 mg total) by mouth every 8 (eight) hours as needed for nausea or vomiting.  Previous Medications   ALBUTEROL (PROVENTIL HFA;VENTOLIN HFA) 108 (90 BASE) MCG/ACT INHALER    Inhale 2 puffs every 6 (six) hours as needed into the lungs.   DULOXETINE (CYMBALTA) 30 MG CAPSULE    Take 1 capsule (30 mg total) by mouth 2 (two) times daily.   INDOMETHACIN (INDOCIN) 50 MG CAPSULE    TAKE 50 MG 3 TIMES DAILY ONLY WHEN NEEDED FOR FLARES OF GOUT   LEDIPASVIR-SOFOSBUVIR (HARVONI) 90-400 MG TABS    Take 1 tablet by mouth daily.   MELATONIN GUMMIES PO    Take at bedtime by mouth.   MOMETASONE-FORMOTEROL (DULERA) 100-5 MCG/ACT AERO    INHALE 2 PUFFS INTO THE LUNGS 2 (TWO) TIMES DAILY   TIOTROPIUM BROMIDE MONOHYDRATE (SPIRIVA RESPIMAT) 2.5 MCG/ACT AERS    Inhale 2 puffs daily into the lungs.  Modified Medications   No medications on file  Discontinued Medications   No medications on file    Allergies: No Known Allergies  Past Medical History: Past Medical History:  Diagnosis Date  . Asthma   . COPD (chronic obstructive pulmonary disease) (HCC)   . Gout     Social History: Social History   Socioeconomic History  . Marital status: Married    Spouse name: Not on file  . Number  of children: Not on file  . Years of education: Not on file  . Highest education level: Not on file  Occupational History  . Not on file  Social Needs  . Financial resource strain: Not on file  . Food insecurity:    Worry: Not on file    Inability: Not on file  . Transportation needs:    Medical: Not on file    Non-medical: Not on file  Tobacco Use  . Smoking status: Current Every Day Smoker    Packs/day: 1.00    Years: 47.00    Pack years: 47.00  . Smokeless tobacco: Current User    Types: Snuff  Substance and Sexual Activity  . Alcohol use: Yes    Alcohol/week: 24.0 standard drinks    Types: 24 Cans of beer per week  . Drug use: No  . Sexual activity: Yes    Partners: Female  Lifestyle  . Physical activity:    Days per week: Not on file    Minutes per session: Not on file  . Stress: Not on file  Relationships  . Social connections:    Talks on phone: Not on file    Gets together: Not on file    Attends religious service: Not on file    Active member of club or organization: Not on file  Attends meetings of clubs or organizations: Not on file    Relationship status: Not on file  Other Topics Concern  . Not on file  Social History Narrative  . Not on file    Labs: Hepatitis C Lab Results  Component Value Date   HCVGENOTYPE 1a 12/01/2017   HCVRNAPCRQN 13,500,000 (H) 12/01/2017   FIBROSTAGE F0 12/12/2017   Hepatitis B Lab Results  Component Value Date   HEPBSAB NON-REACTIVE 12/12/2017   HEPBSAG NON-REACTIVE 12/12/2017   HEPBCAB REACTIVE (A) 12/12/2017   Hepatitis A Lab Results  Component Value Date   HAV NON-REACTIVE 12/12/2017   HIV Lab Results  Component Value Date   HIV Non Reactive 10/24/2017   Lab Results  Component Value Date   CREATININE 0.87 10/24/2017   CREATININE 1.10 04/03/2017   CREATININE 0.90 04/18/2014   CREATININE 0.96 03/24/2012   CREATININE 0.83 12/14/2010   Lab Results  Component Value Date   AST 24 10/24/2017   AST  13 04/03/2017   AST 21 04/18/2014   ALT 17 12/12/2017   ALT 20 10/24/2017   ALT 11 04/03/2017   INR 1.91 (H) 12/14/2010   INR 1.41 12/13/2010   INR 1.08 12/12/2010   Assessment: Khole has been having some adverse effects on Harvoni. He reports that he has trouble sleeping, nausea, occasional diarrhea, fatigue, and headache. He states that he takes his Harvoni around 6pm with his dinner and takes melatonin to help him sleep, but he finds that he wakes up around 1am and can't fall back asleep. He says he has diarrhea maybe 3 out of 5 days during the week and for his headaches, he's been taking ibuprofen liquid gels. We recommended to maybe try taking his Harvoni in the morning to help with his sleep and/or taking Zofran about 30 minutes before his Harvoni to help with the nausea. We reminded him that this is only a short-term medication and encouraged him to try and push through these last 2 months of treatment.  Merion also reports having only today's dose of his Harvoni left. He had been unable to talk with the pharmacy due to his work schedule. He says that they will try to send it next-day air today, but if they're unable to do that, the soonest he will receive his next bottle is Friday. We informed him that missing one or two doses shouldn't hurt his chances of cure too much, but instructed him to call us if he didn't receive his next bottle within the next few days. We will get an HCV viral load today  When offered a flu shot, Channing declined, but did say he wanted the pneumonia vaccine, so he was given his 1st PPSV23 shot. He did state that he is a current smoker and when I asked him about whether he had thought about quitting, he said he knows he should quit but says he needs to "think a bit more" about it. I encouraged him that was a good first step and that quitting would be a great thing to do for his health.  Plan: -Continue Harvoni -Start Zofran 4 mg PO q8h PRN -PPSV23 #1 today -HCV VL  today -F/u with pharmacy on 11/22 at 9:15am for his EOT visit  Arvilla Market, PharmD PGY1 Pharmacy Resident Phone [phone] 02/11/2018     1:38 PM

## 2018-02-13 LAB — HEPATITIS C RNA QUANTITATIVE
HCV QUANT LOG: NOT DETECTED {Log_IU}/mL
HCV RNA, PCR, QN: NOT DETECTED [IU]/mL

## 2018-02-17 ENCOUNTER — Telehealth: Payer: Self-pay | Admitting: Pharmacist

## 2018-02-17 NOTE — Telephone Encounter (Signed)
Patient is on Harvoni x 12 weeks for his chronic Hepatitis C infection. He started back at the end of August.  I saw him on 9/25 and he had one more pill left for Thursday 9/26. [facility] told him that they would ship him the medication to arrive Friday. I instructed patient to call us ASAP if he did not receive it.  Patient calls me today to tell me he has not received his 2nd bottle yet. After stressing to him how important it was that he not miss doses and that he should have called earlier, he got a little angry and stated that he was at work and did not have time to deal with this.  After some discussion, he agreed to call [facility] (they will not let Kathie Rhodes or I call for refills due to it being delivered to the patient's house). He called and they told him that their ordering system was messed up and it will be here tomorrow at his house.  He will have missed 5 doses.  I told him this was unacceptable and that the pharmacy should have taken better care of him.  He will call us if he does not get it tomorrow.  I also instructed him to make sure he calls in 3 weeks for his last refill as they are not reliable.

## 2018-03-24 ENCOUNTER — Encounter: Payer: Self-pay | Admitting: Physician Assistant

## 2018-03-24 ENCOUNTER — Other Ambulatory Visit: Payer: Self-pay

## 2018-03-24 ENCOUNTER — Ambulatory Visit (INDEPENDENT_AMBULATORY_CARE_PROVIDER_SITE_OTHER): Payer: BLUE CROSS/BLUE SHIELD

## 2018-03-24 ENCOUNTER — Ambulatory Visit: Payer: BLUE CROSS/BLUE SHIELD | Admitting: Physician Assistant

## 2018-03-24 VITALS — BP 160/78 | HR 98 | Temp 98.7°F | Resp 20 | Ht 71.77 in | Wt 161.0 lb

## 2018-03-24 DIAGNOSIS — R030 Elevated blood-pressure reading, without diagnosis of hypertension: Secondary | ICD-10-CM | POA: Diagnosis not present

## 2018-03-24 DIAGNOSIS — R0602 Shortness of breath: Secondary | ICD-10-CM | POA: Diagnosis not present

## 2018-03-24 DIAGNOSIS — R5383 Other fatigue: Secondary | ICD-10-CM

## 2018-03-24 DIAGNOSIS — J441 Chronic obstructive pulmonary disease with (acute) exacerbation: Secondary | ICD-10-CM

## 2018-03-24 DIAGNOSIS — J449 Chronic obstructive pulmonary disease, unspecified: Secondary | ICD-10-CM | POA: Diagnosis not present

## 2018-03-24 LAB — POCT CBC
Granulocyte percent: 73.1 %G (ref 37–80)
HCT, POC: 44.9 % — AB (ref 29–41)
Hemoglobin: 15.2 g/dL — AB (ref 9.5–13.5)
Lymph, poc: 2 (ref 0.6–3.4)
MCH, POC: 31.1 pg (ref 27–31.2)
MCHC: 33.8 g/dL (ref 31.8–35.4)
MCV: 92.1 fL (ref 76–111)
MID (CBC): 0.6 (ref 0–0.9)
MPV: 6.1 fL (ref 0–99.8)
POC Granulocyte: 7 — AB (ref 2–6.9)
POC LYMPH %: 20.5 % (ref 10–50)
POC MID %: 6.4 %M (ref 0–12)
Platelet Count, POC: 373 10*3/uL (ref 142–424)
RBC: 4.88 M/uL (ref 4.69–6.13)
RDW, POC: 13.2 %
WBC: 9.6 10*3/uL (ref 4.6–10.2)

## 2018-03-24 LAB — POCT INFLUENZA A/B
INFLUENZA A, POC: NEGATIVE
INFLUENZA B, POC: NEGATIVE

## 2018-03-24 MED ORDER — IPRATROPIUM BROMIDE HFA 17 MCG/ACT IN AERS: 2.0000 | INHALATION_SPRAY | RESPIRATORY_TRACT | 0 refills | Status: DC | PRN

## 2018-03-24 MED ORDER — PREDNISONE 20 MG PO TABS: 40.0000 mg | ORAL_TABLET | Freq: Every day | ORAL | 0 refills | Status: AC

## 2018-03-24 MED ORDER — ALBUTEROL SULFATE (2.5 MG/3ML) 0.083% IN NEBU
2.5000 mg | INHALATION_SOLUTION | Freq: Once | RESPIRATORY_TRACT | Status: AC
  Administered 2018-03-24: 2.5 mg via RESPIRATORY_TRACT

## 2018-03-24 MED ORDER — ALBUTEROL SULFATE HFA 108 (90 BASE) MCG/ACT IN AERS: 2.0000 | INHALATION_SPRAY | Freq: Four times a day (QID) | RESPIRATORY_TRACT | 0 refills | Status: DC | PRN

## 2018-03-24 MED ORDER — IPRATROPIUM BROMIDE 0.02 % IN SOLN
0.5000 mg | Freq: Once | RESPIRATORY_TRACT | Status: AC
Start: 2018-03-24 — End: 2018-03-24
  Administered 2018-03-24: 0.5 mg via RESPIRATORY_TRACT

## 2018-03-24 MED ORDER — IPRATROPIUM BROMIDE 0.02 % IN SOLN
0.5000 mg | Freq: Once | RESPIRATORY_TRACT | Status: AC
  Administered 2018-03-24: 0.5 mg via RESPIRATORY_TRACT

## 2018-03-24 MED ORDER — AZITHROMYCIN 250 MG PO TABS: ORAL_TABLET | ORAL | 0 refills | Status: DC

## 2018-03-24 MED ORDER — METHYLPREDNISOLONE SODIUM SUCC 125 MG IJ SOLR
80.0000 mg | Freq: Once | INTRAMUSCULAR | Status: AC
  Administered 2018-03-24: 80 mg via INTRAMUSCULAR

## 2018-03-24 NOTE — Progress Notes (Signed)
MRN: [id number] DOB: 1956/05/08  Subjective:   Anthony Mayo is a 62 y.o. male presenting for chief complaint of Cough (X 3 mth ); Nasal Congestion (X 4 days chest congestion); Headache (X 4 days pt states  light head at times); and Fatigue (X 4 days) .  Reports 4 day history of nasal congestion, headache, fatigue, body aches, subjective fever 2 days ago resolved, and productive cough. Has SOB and wheezing.  Has tried over the counter meds with no relief. Denies sinus pain, ear pain and sore throat, nausea, vomiting, abdominal pain and diarrhea. Has had sick contact with coworker who had the flu. No history of seasonal allergies, has PMH of COPD, uses dulera daily, albuterol prn, used it about 4 x this past few days. Patient has not flu shot this season. smoes 1 ppd x 47 years. Denies any other aggravating or relieving factors, no other questions or concerns.  Anthony Mayo has a current medication list which includes the following prescription(s): albuterol, indomethacin, ledipasvir-sofosbuvir, melatonin, mometasone-formoterol, ondansetron, tiotropium bromide monohydrate, azithromycin, duloxetine, ipratropium, and prednisone, and the following Facility-Administered Medications: albuterol and ipratropium. Also has No Known Allergies.  Anthony Mayo  has a past medical history of Asthma, COPD (chronic obstructive pulmonary disease) (HCC), and Gout. Also  has a past surgical history that includes Replacement total knee (12/11/2010).   Objective:   Vitals: BP (!) 160/78   Pulse 98   Temp 98.7 F (37.1 C) (Oral)   Resp 20   Ht 5' 11.77" (1.823 m)   Wt 161 lb (73 kg)   SpO2 98%   PF 350 L/min Comment: 300,300,350  BMI 21.97 kg/m   Physical Exam  Constitutional: He is oriented to person, place, and time. He appears well-developed and well-nourished. No distress.  Appears older than stated age.  HENT:  Head: Normocephalic and atraumatic.  Right Ear: Tympanic membrane, external ear and ear canal normal.    Left Ear: Tympanic membrane, external ear and ear canal normal.  Nose: Mucosal edema and rhinorrhea present. Right sinus exhibits no maxillary sinus tenderness and no frontal sinus tenderness. Left sinus exhibits no maxillary sinus tenderness and no frontal sinus tenderness.  Mouth/Throat: Uvula is midline and mucous membranes are normal. No posterior oropharyngeal edema, posterior oropharyngeal erythema or tonsillar abscesses. No tonsillar exudate.  Eyes: Conjunctivae are normal.  Neck: Normal range of motion.  Cardiovascular: Normal rate, regular rhythm, normal heart sounds and intact distal pulses.  Pulmonary/Chest: Effort normal. No accessory muscle usage. No tachypnea. He has no decreased breath sounds. He has wheezes (diffuse in b/l lung fields). He has no rhonchi. He has no rales.  Lymphadenopathy:       Head (right side): No submental, no submandibular, no tonsillar, no preauricular, no posterior auricular and no occipital adenopathy present.       Head (left side): No submental, no submandibular, no tonsillar, no preauricular, no posterior auricular and no occipital adenopathy present.    He has no cervical adenopathy.       Right: No supraclavicular adenopathy present.       Left: No supraclavicular adenopathy present.  Neurological: He is alert and oriented to person, place, and time.  Skin: Skin is warm and dry.  Psychiatric: He has a normal mood and affect.  Vitals reviewed.   Results for orders placed or performed in visit on 03/24/18 (from the past 24 hour(s))  POCT Influenza A/B     Status: None   Collection Time: 03/24/18 12:31 PM  Result  Value Ref Range   Influenza A, POC Negative Negative   Influenza B, POC Negative Negative  POCT CBC     Status: Abnormal   Collection Time: 03/24/18 12:46 PM  Result Value Ref Range   WBC 9.6 4.6 - 10.2 K/uL   Lymph, poc 2.0 0.6 - 3.4   POC LYMPH PERCENT 20.5 10 - 50 %L   MID (cbc) 0.6 0 - 0.9   POC MID % 6.4 0 - 12 %M   POC  Granulocyte 7.0 (A) 2 - 6.9   Granulocyte percent 73.1 37 - 80 %G   RBC 4.88 4.69 - 6.13 M/uL   Hemoglobin 15.2 (A) 9.5 - 13.5 g/dL   HCT, POC 09.8 (A) 29 - 41 %   MCV 92.1 76 - 111 fL   MCH, POC 31.1 27 - 31.2 pg   MCHC 33.8 31.8 - 35.4 g/dL   RDW, POC 11.9 %   Platelet Count, POC 373 142 - 424 K/uL   MPV 6.1 0 - 99.8 fL    BP Readings from Last 3 Encounters:  03/24/18 (!) 160/78  12/01/17 (!) 153/71  11/08/17 135/72     Dg Chest 2 View  Result Date: 03/24/2018 CLINICAL DATA:  COPD exacerbation. EXAM: CHEST - 2 VIEW COMPARISON:  04/01/2017 FINDINGS: The cardiac silhouette, mediastinal and hilar contours are normal in stable. Stable emphysematous changes. No acute overlying pulmonary process. No worrisome pulmonary lesions. The bony thorax is intact. IMPRESSION: Stable emphysematous changes but no acute overlying pulmonary process. Electronically Signed   By: Rudie Meyer M.D.   On: 03/24/2018 13:12    Two breathing treatment of duonebs given in office. Patient reports feeling much better. Wheezes resolved upon auscultation of lungs, coarse breath sounds noted b/l.   Peak flow increased from 310 (~56% of age predicted) pre breathing treatment to 350 post breathing treatment (63% of age predicted). SpO2 increased from 94% pre breathing treatment to 98% post breathing treatment.   Assessment and Plan :  1. COPD exacerbation (HCC) History and physical exam findings consistent with COPD exacerbation.  Flu test negative.  CBC WNL.  Chest x-ray with no acute findings.  Patient has responded well to 2 DuoNeb's and steroid injection in office.  SPO2 98%.  Wheezing has resolved.  He does not appear in respiratory distress.  Recommend outpatient management at this time with close follow-up.  Given Rx for azithromycin, prednisone, and albuterol and Atrovent inhalers to use every 4-6 hours as needed for wheezing and shortness of breath.  Plan to follow-up in 48 hours.  Given strict ED precautions  if symptoms worsen or he develops new concerning symptoms. - albuterol (PROVENTIL) (2.5 MG/3ML) 0.083% nebulizer solution 2.5 mg - ipratropium (ATROVENT) nebulizer solution 0.5 mg - POCT CBC - Care order/instruction - methylPREDNISolone sodium succinate (SOLU-MEDROL) 125 mg/2 mL injection 80 mg - albuterol (PROVENTIL) (2.5 MG/3ML) 0.083% nebulizer solution 2.5 mg - ipratropium (ATROVENT) nebulizer solution 0.5 mg - Care order/instruction - azithromycin (ZITHROMAX) 250 MG tablet; Take 2 tabs PO x 1 dose, then 1 tab PO QD x 4 days  Dispense: 6 tablet; Refill: 0 - predniSONE (DELTASONE) 20 MG tablet; Take 2 tablets (40 mg total) by mouth daily with breakfast for 5 days.  Dispense: 10 tablet; Refill: 0 - ipratropium (ATROVENT HFA) 17 MCG/ACT inhaler; Inhale 2 puffs into the lungs every 4 (four) hours as needed for wheezing.  Dispense: 1 Inhaler; Refill: 0 - albuterol (PROVENTIL HFA;VENTOLIN HFA) 108 (90 Base) MCG/ACT inhaler; Inhale  2 puffs into the lungs every 6 (six) hours as needed.  Dispense: 1 Inhaler; Refill: 0  2. Other fatigue - POCT Influenza A/B  3. SOB (shortness of breath) - DG Chest 2 View; Future  4. Elevated blood pressure reading Likely due to COPD exacerbation.  Otherwise asx. Recommend checking blood pressure (once COPD exacerbation has resolved) Outside the office and following up if consistently greater than 140/90.  Side effects, risks, benefits, and alternatives of the medications and treatment plan prescribed today were discussed, and patient expressed understanding of the instructions given. No barriers to understanding were identified. Red flags discussed in detail. Pt expressed understanding regarding what to do in case of emergency/urgent symptoms.   Benjiman Core, PA-C  Primary Care at [facility] 03/24/2018 1:49 PM

## 2018-03-24 NOTE — Patient Instructions (Addendum)
Start antibiotic and prednisone today.  Use albuterol and atrovent inhaler together every 4-6 hours as needed for wheezing and shortness of breath. Use dulera daily as you are.  Follow up in 2 days for reevaluation or sooner if symptoms worsen.  Thank you for letting me participate in your health and well being.    If you have lab work done today you will be contacted with your lab results within the next 2 weeks.  If you have not heard from [place] then please contact us. The fastest way to get your results is to register for My Chart.   Chronic Obstructive Pulmonary Disease Exacerbation Chronic obstructive pulmonary disease (COPD) is a common lung problem. In COPD, the flow of air from the lungs is limited. COPD exacerbations are times that breathing gets worse and you need extra treatment. Without treatment they can be life threatening. If they happen often, your lungs can become more damaged. If your COPD gets worse, your doctor may treat you with:  Medicines.  Oxygen.  Different ways to clear your airway, such as using a mask.  Follow these instructions at home:  Do not smoke.  Avoid tobacco smoke and other things that bother your lungs.  If given, take your antibiotic medicine as told. Finish the medicine even if you start to feel better.  Only take medicines as told by your doctor.  Drink enough fluids to keep your pee (urine) clear or pale yellow (unless your doctor has told you not to).  Use a cool mist machine (vaporizer).  If you use oxygen or a machine that turns liquid medicine into a mist (nebulizer), continue to use them as told.  Keep up with shots (vaccinations) as told by your doctor.  Exercise regularly.  Eat healthy foods.  Keep all doctor visits as told. Get help right away if:  You are very short of breath and it gets worse.  You have trouble talking.  You have bad chest pain.  You have blood in your spit (sputum).  You have a fever.  You keep  throwing up (vomiting).  You feel weak, or you pass out (faint).  You feel confused.  You keep getting worse. This information is not intended to replace advice given to you by your health care provider. Make sure you discuss any questions you have with your health care provider. Document Released: 04/25/2011 Document Revised: 10/12/2015 Document Reviewed: 01/08/2013 Elsevier Interactive Patient Education  2017 [employer].    IF you received an x-ray today, you will receive an invoice from [facility]. Please contact [facility] at [phone] with questions or concerns regarding your invoice.   IF you received labwork today, you will receive an invoice from [facility]. Please contact [facility] at [phone] with questions or concerns regarding your invoice.   Our billing staff will not be able to assist you with questions regarding bills from these companies.  You will be contacted with the lab results as soon as they are available. The fastest way to get your results is to activate your My Chart account. Instructions are located on the last page of this paperwork. If you have not heard from [place] regarding the results in 2 weeks, please contact this office.

## 2018-03-25 ENCOUNTER — Telehealth: Payer: Self-pay | Admitting: Pharmacist

## 2018-03-25 ENCOUNTER — Ambulatory Visit: Payer: BLUE CROSS/BLUE SHIELD | Admitting: Pharmacy Technician

## 2018-03-25 NOTE — Telephone Encounter (Signed)
Patient has had a lot of issues with adherence to his Harvoni medication for Hepatitis C. He has also had a lot of issues with [facility]. He works all the time and never answers his phone either. He is supposed to have 12 weeks total of Harvoni due to his high baseline Hep C viral load 13.5 million. He missed about a week between his 1st and 2nd bottle because he couldn't answer the pharmacy's call to verify mailing. I had to convince him to call them (he had a lot of excuses). Then he calls [place] today and tells Kathie Rhodes and I that he has been out of his 2nd bottle since Friday (5 days). We were able to get his 3rd bottle of Harvoni quickly and is here at the clinic but he almost wouldn't come and pick it up.  His Hep C viral load was undetectable back on 9/25 but we will see if his lapses in doses will hinder his chance of cure.  He will come to see me at his end of therapy appointment the first week of December.

## 2018-03-26 ENCOUNTER — Ambulatory Visit (INDEPENDENT_AMBULATORY_CARE_PROVIDER_SITE_OTHER): Payer: BLUE CROSS/BLUE SHIELD | Admitting: Physician Assistant

## 2018-03-26 ENCOUNTER — Encounter: Payer: Self-pay | Admitting: Physician Assistant

## 2018-03-26 ENCOUNTER — Emergency Department (HOSPITAL_COMMUNITY)
Admission: EM | Admit: 2018-03-26 | Discharge: 2018-03-26 | Disposition: A | Discharge status: Home / Self Care | Payer: BLUE CROSS/BLUE SHIELD | Attending: Emergency Medicine | Admitting: Emergency Medicine

## 2018-03-26 ENCOUNTER — Encounter (HOSPITAL_COMMUNITY): Payer: Self-pay

## 2018-03-26 ENCOUNTER — Other Ambulatory Visit: Payer: Self-pay

## 2018-03-26 VITALS — BP 144/72 | HR 78 | Temp 98.0°F | Resp 18 | Ht 71.0 in | Wt 161.0 lb

## 2018-03-26 DIAGNOSIS — R0602 Shortness of breath: Secondary | ICD-10-CM | POA: Diagnosis not present

## 2018-03-26 DIAGNOSIS — F17210 Nicotine dependence, cigarettes, uncomplicated: Secondary | ICD-10-CM | POA: Diagnosis not present

## 2018-03-26 DIAGNOSIS — J441 Chronic obstructive pulmonary disease with (acute) exacerbation: Secondary | ICD-10-CM

## 2018-03-26 DIAGNOSIS — R9431 Abnormal electrocardiogram [ECG] [EKG]: Secondary | ICD-10-CM | POA: Diagnosis not present

## 2018-03-26 DIAGNOSIS — Z743 Need for continuous supervision: Secondary | ICD-10-CM | POA: Diagnosis not present

## 2018-03-26 DIAGNOSIS — R279 Unspecified lack of coordination: Secondary | ICD-10-CM | POA: Diagnosis not present

## 2018-03-26 DIAGNOSIS — R062 Wheezing: Secondary | ICD-10-CM | POA: Diagnosis present

## 2018-03-26 DIAGNOSIS — Z79899 Other long term (current) drug therapy: Secondary | ICD-10-CM | POA: Insufficient documentation

## 2018-03-26 MED ORDER — ALBUTEROL SULFATE (2.5 MG/3ML) 0.083% IN NEBU
2.5000 mg | INHALATION_SOLUTION | Freq: Once | RESPIRATORY_TRACT | Status: AC
  Administered 2018-03-26: 2.5 mg via RESPIRATORY_TRACT

## 2018-03-26 MED ORDER — METHYLPREDNISOLONE SODIUM SUCC 125 MG IJ SOLR
125.0000 mg | Freq: Once | INTRAMUSCULAR | Status: AC
  Administered 2018-03-26: 125 mg via INTRAMUSCULAR

## 2018-03-26 MED ORDER — ALBUTEROL SULFATE (2.5 MG/3ML) 0.083% IN NEBU: 2.5000 mg | INHALATION_SOLUTION | Freq: Four times a day (QID) | RESPIRATORY_TRACT | 1 refills | Status: DC | PRN

## 2018-03-26 MED ORDER — IPRATROPIUM BROMIDE 0.02 % IN SOLN: 0.5000 mg | Freq: Once | RESPIRATORY_TRACT | Status: DC

## 2018-03-26 MED ORDER — ALL-IN-ONE NEBULIZER SYSTEM MISC: 1.0000 [IU] | 0 refills | Status: DC | PRN

## 2018-03-26 MED ORDER — IPRATROPIUM BROMIDE 0.02 % IN SOLN: 0.5000 mg | Freq: Four times a day (QID) | RESPIRATORY_TRACT | 1 refills | Status: DC

## 2018-03-26 NOTE — ED Triage Notes (Signed)
Per [facility]- Pt seen at urgent care for shortness of breath and 3 months with increased since Friday. Seen two days and given treatment ( NEBs, steroids and z-pack) Pt is not improved. Returned to urgent for re evaluation. Solumedrol 125 mcg IM and Albuterol 10mg  NEB at office and EMS gave DUONEB. Pt's respiratory status expiratory wheezing with some improvement. Pt without distress. Fever earlier this week however not present. No other complaints.

## 2018-03-26 NOTE — ED Notes (Signed)
Bed: WA10 Expected date:  Expected time:  Means of arrival:  Comments: EMS-SOB 

## 2018-03-26 NOTE — ED Notes (Signed)
PT REPORTS AT URGENT CARE AFTER BREATHING TREATMENTS 02 SATS DECREASED TO 86% RA WITH WALKING. PT CURRENTLY 96% RA ON STRETCHER. PT IS WITHOUT COMPLAINT AND IS BREATHING BETTER.

## 2018-03-26 NOTE — Progress Notes (Signed)
MRN: [id number] DOB: 1956/03/12  Subjective:   Anthony Mayo is a 62 y.o. male presenting for chief complaint of COPD (2 day follow up; states he is still having some problems; but feels just a little better) .  Today, patient reports that he felt really good after leaving the office 2 days ago.  His cough returned a few hours after being home and has been pretty persistent since.  His wheezing and shortness of breath have improved but still noticeable. Having DOE.  Overall he feels about 20% better.  Taking prednisone, azithromycin, and Dulera as prescribed.  Using albuterol inhaler every 4-6 hours.  Did not pick up Atrovent inhaler as it was not in stock at his pharmacy.  Denies chest pain, fever, chills, lower leg swelling, nausea, vomiting, diarrhea.  Current everyday smoker.  Smokes 1 pack/day.  In terms of maintenance inhalers, he is using [facility] which he does not have refills for and has not been using Spiriva. Last used albuterol inhaler 3 hours ago.  Did not have breathing treatments at home.  Marland Kitchen  Jaelin has a current medication list which includes the following prescription(s): albuterol, azithromycin, indomethacin, ipratropium, ledipasvir-sofosbuvir, melatonin, mometasone-formoterol, ondansetron, prednisone, tiotropium bromide monohydrate, and duloxetine. Also has No Known Allergies.   Patient Active Problem List   Diagnosis Date Noted  . Chronic hepatitis C without hepatic coma ([place]) 11/08/2017  . Dizziness 10/24/2017  . Recurrent headache 10/24/2017  . Depression with anxiety 10/24/2017  . Cigarette smoker 04/04/2017  . Dyspnea on exertion 04/01/2017  . COPD   GOLD II/ still smoking 03/24/2012  . Asthma 03/19/2012    Anthony Mayo  has a past medical history of Asthma, COPD (chronic obstructive pulmonary disease) ([place]), and Gout. Also  has a past surgical history that includes Replacement total knee (12/11/2010).   Objective:   Vitals: BP (!) 144/72   Pulse 78   Temp 98 F (36.7  C) (Oral)   Resp 18   Ht '5\' 11"'$  (1.803 m)   Wt 161 lb (73 kg)   SpO2 96%   BMI 22.45 kg/m   Physical Exam  Constitutional: He is oriented to person, place, and time. He appears well-developed and well-nourished. No distress.  HENT:  Head: Normocephalic and atraumatic.  Eyes: Conjunctivae are normal.  Neck: Normal range of motion.  Cardiovascular: Normal rate, regular rhythm, normal heart sounds and intact distal pulses.  Pulmonary/Chest: Effort normal. No accessory muscle usage or stridor. No tachypnea. No respiratory distress. He has decreased breath sounds. He has wheezes (diffuse b/l lung fields). He has rhonchi. He has no rales.  Musculoskeletal:       Right lower leg: He exhibits no swelling.  Neurological: He is alert and oriented to person, place, and time.  Skin: Skin is warm and dry.  Psychiatric: He has a normal mood and affect.  Vitals reviewed.   No results found for this or any previous visit (from the past 24 hour(s)).   Breathing treatment of 2 albuterol nebs given in office, along with solumendrol '125mg'$  injection.  Patient reports feeling a little better but not as good as he did the other day.  Diffuse wheezes still auscultated on lung exam. Peak flow increased from 280 pre breathing treatment to 340 (~61% of age predicted) post breathing treatment. SpO2 decreased from 96% pre breathing treatment to 95% post breathing treatment.   SpO2 drops to 86% with ambulation x 1 lap around office.   Assessment and Plan :  1. COPD  exacerbation ([place]) Overall patient is well-appearing, no acute distress.  He is not tachypneic or using accessory muscles.  However, he has not shown any improvement with albuterol nebs and Solu-Medrol injection in office.  Diffuse wheezes still auscultated on lung exam.  SPO2 at rest is 95%, drops to 86% with ambulation.  Suggest emergent evaluation by ED for further management.  He is currently on oral prednisone, azithromycin, Dulera inhaler, and  albuterol inhaler as needed.  Not taking Spiriva inhaler or Atrovent inhaler.  Continues to smoke.  Given Rx for nebulizer machine and neb medication once he is discharged from ED and to have for future events.  Strongly encouraged patient to follow-up with pulmonology once he is discharged as he has not seen them in about a year.  EMS contacted and transfer patient to ED. - albuterol (PROVENTIL) (2.5 MG/3ML) 0.083% nebulizer solution 2.5 mg - Care order/instruction - CMP14+EGFR - methylPREDNISolone sodium succinate (SOLU-MEDROL) 125 mg/2 mL injection 125 mg - albuterol (PROVENTIL) (2.5 MG/3ML) 0.083% nebulizer solution 2.5 mg   Tenna Delaine, PA-C  [facility] 03/26/2018 9:25 AM

## 2018-03-26 NOTE — ED Provider Notes (Signed)
[facility] DEPT Provider Note   CSN: [id number] Arrival date & time: 03/26/18  1139     History   Chief Complaint Chief Complaint  Patient presents with  . Shortness of Breath  . Fever    HPI Caulin Begley is a 62 y.o. male.  Patient is a 62 year old male with a history of hepatitis C, depression, ongoing tobacco abuse and COPD stage II presenting from PCP office for ongoing wheezing and hypoxia with exertion.  Patient states that approximately 1 week ago he had URI symptoms with fever, cough, congestion, rhinorrhea that then went down into his lungs causing wheezing and shortness of breath.  He saw his doctor 2 days ago and at that time received Solu-Medrol, albuterol, Atrovent and azithromycin.  He felt significantly better after leaving the office and states he was supposed to follow-up in 2 days.  He says he was feeling better today but went to his follow-up.  There he was wheezing diffusely and desatted to 86% on room air with ambulating.  He says he is currently not feeling short of breath.  He did receive Solu-Medrol in the office and to albuterol nebs.  Patient denies any further fevers.  He has had no chest pain, abdominal pain, nausea or vomiting.  He states over the last year he had had weight loss and difficulty sleeping and feeling fatigued all the time which he attributed to a stressful work environment but states that he is starting to gain weight again.  He was supposed to fill a prescription for Atrovent but states the pharmacy did not have it when they went but it should be present now.  The history is provided by the patient.    Past Medical History:  Diagnosis Date  . Asthma   . COPD (chronic obstructive pulmonary disease) ([place])   . Gout     Patient Active Problem List   Diagnosis Date Noted  . Chronic hepatitis C without hepatic coma ([place]) 11/08/2017  . Dizziness 10/24/2017  . Recurrent headache 10/24/2017  . Depression with  anxiety 10/24/2017  . Cigarette smoker 04/04/2017  . Dyspnea on exertion 04/01/2017  . COPD   GOLD II/ still smoking 03/24/2012  . Asthma 03/19/2012    Past Surgical History:  Procedure Laterality Date  . REPLACEMENT TOTAL KNEE  12/11/2010        Home Medications    Prior to Admission medications   Medication Sig Start Date End Date Taking? Authorizing Provider  albuterol (PROVENTIL HFA;VENTOLIN HFA) 108 (90 Base) MCG/ACT inhaler Inhale 2 puffs into the lungs every 6 (six) hours as needed. 03/24/18   Tenna Delaine D, PA-C  albuterol (PROVENTIL) (2.5 MG/3ML) 0.083% nebulizer solution Take 3 mLs (2.5 mg total) by nebulization every 6 (six) hours as needed for wheezing or shortness of breath. 03/26/18   Tenna Delaine D, PA-C  ALL-IN-ONE NEBULIZER SYSTEM MISC 1 Units by Does not apply route as needed. Please dispense a nebulizer kit including mask in accordance with her insurance coverage. 03/26/18   Tenna Delaine D, PA-C  azithromycin (ZITHROMAX) 250 MG tablet Take 2 tabs PO x 1 dose, then 1 tab PO QD x 4 days 03/24/18   Tenna Delaine D, PA-C  DULoxetine (CYMBALTA) 30 MG capsule Take 1 capsule (30 mg total) by mouth 2 (two) times daily. Patient not taking: Reported on 12/01/2017 11/08/17 12/08/17  Horald Pollen, MD  indomethacin (INDOCIN) 50 MG capsule TAKE 50 MG 3 TIMES DAILY ONLY WHEN NEEDED FOR FLARES  OF GOUT 12/06/17   Horald Pollen, MD  ipratropium (ATROVENT HFA) 17 MCG/ACT inhaler Inhale 2 puffs into the lungs every 4 (four) hours as needed for wheezing. 03/24/18   Tenna Delaine D, PA-C  ipratropium (ATROVENT) 0.02 % nebulizer solution Take 2.5 mLs (0.5 mg total) by nebulization 4 (four) times daily. 03/26/18   Tenna Delaine D, PA-C  Ledipasvir-Sofosbuvir (HARVONI) 90-400 MG TABS Take 1 tablet by mouth daily. 12/22/17   Kuppelweiser, Cassie L, RPH-CPP  MELATONIN GUMMIES PO Take at bedtime by mouth.    [provider]  mometasone-formoterol (DULERA)  100-5 MCG/ACT AERO INHALE 2 PUFFS INTO THE LUNGS 2 (TWO) TIMES DAILY 04/03/17   Tanda Rockers, MD  ondansetron (ZOFRAN ODT) 4 MG disintegrating tablet Take 1 tablet (4 mg total) by mouth every 8 (eight) hours as needed for nausea or vomiting. 02/11/18   Kuppelweiser, Cassie L, RPH-CPP  predniSONE (DELTASONE) 20 MG tablet Take 2 tablets (40 mg total) by mouth daily with breakfast for 5 days. 03/24/18 03/29/18  Tenna Delaine D, PA-C  Tiotropium Bromide Monohydrate (SPIRIVA RESPIMAT) 2.5 MCG/ACT AERS Inhale 2 puffs daily into the lungs. 04/03/17   Tanda Rockers, MD    Family History Family History  Problem Relation Age of Onset  . Prostate cancer Father     Social History Social History   Tobacco Use  . Smoking status: Current Every Day Smoker    Packs/day: 1.00    Years: 47.00    Pack years: 47.00    Types: Cigarettes  . Smokeless tobacco: Current User    Types: Snuff  Substance Use Topics  . Alcohol use: Yes    Alcohol/week: 24.0 standard drinks    Types: 24 Cans of beer per week  . Drug use: No     Allergies   Patient has no known allergies.   Review of Systems Review of Systems  All other systems reviewed and are negative.    Physical Exam Updated Vital Signs BP (!) 150/76 (BP Location: Right Arm) Comment: Simultaneous filing. User may not have seen previous data.  Pulse 98   Temp 98.3 F (36.8 C) (Oral) Comment: Pt using nebulizer  Resp 18   Ht '5\' 11"'$  (1.803 m)   Wt 72.6 kg   SpO2 93%   BMI 22.32 kg/m   Physical Exam  Constitutional: He is oriented to person, place, and time. He appears well-developed. He appears cachectic. No distress.  HENT:  Head: Normocephalic and atraumatic.  Mouth/Throat: Oropharynx is clear and moist.  Eyes: Pupils are equal, round, and reactive to light. Conjunctivae and EOM are normal.  Neck: Normal range of motion. Neck supple.  Cardiovascular: Normal rate, regular rhythm and intact distal pulses.  No murmur  heard. Pulmonary/Chest: Effort normal. No respiratory distress. He has wheezes. He has no rales.  Scant wheezes in bilateral lower lobes  Abdominal: Soft. He exhibits no distension. There is no tenderness. There is no rebound and no guarding.  Musculoskeletal: Normal range of motion. He exhibits no edema or tenderness.  Neurological: He is alert and oriented to person, place, and time.  Skin: Skin is warm and dry. No rash noted. No erythema.  Psychiatric: He has a normal mood and affect. His behavior is normal.  Nursing note and vitals reviewed.    ED Treatments / Results  Labs (all labs ordered are listed, but only abnormal results are displayed) Labs Reviewed - No data to display  EKG EKG Interpretation  Date/Time:  Thursday March 26 2018 11:56:48 EST Ventricular Rate:  98 PR Interval:    QRS Duration: 96 QT Interval:  351 QTC Calculation: 449 R Axis:   -94 Text Interpretation:  Sinus rhythm Abnormal R-wave progression, late transition Inferior infarct, old No significant change since last tracing Confirmed by Blanchie Dessert [id number]) on 03/26/2018 12:52:25 PM   Radiology No results found.  Procedures Procedures (including critical care time)  Medications Ordered in ED Medications - No data to display   Initial Impression / Assessment and Plan / ED Course  I have reviewed the triage vital signs and the nursing notes.  Pertinent labs & imaging results that were available during my care of the patient were reviewed by me and considered in my medical decision making (see chart for details).     62 year old male with a chronic smoking history and COPD presenting with shortness of breath in the setting of having URI symptoms earlier this week.  Patient was seen at PCP office and at that time was wheezing diffusely.  He did desat with exertion.  He has received Solu-Medrol and given 2 albuterol nebs.  Here patient is well-appearing.  He is satting 100% on room air and  able to speak in complete sentences.  Patient had lab work done 2 days ago and a chest x-ray all which were unchanged.  Low suspicion for pneumonia at this time, PE or ACS.  EKG here without acute findings.  Will walk patient with pulse ox to ensure no desaturation but otherwise feel that patient would be safe for discharge  2:02 PM And ambulated with oxygen saturation remaining at 91%.  At this time feel that he is safe for discharge.  Final Clinical Impressions(s) / ED Diagnoses   Final diagnoses:  COPD exacerbation [facility])    ED Discharge Orders    None       Blanchie Dessert, MD 03/26/18 1402

## 2018-03-26 NOTE — Patient Instructions (Signed)
° ° ° °  If you have lab work done today you will be contacted with your lab results within the next 2 weeks.  If you have not heard from us then please contact us. The fastest way to get your results is to register for My Chart. ° ° °IF you received an x-ray today, you will receive an invoice from [facility]. Please contact [facility] at [phone] with questions or concerns regarding your invoice.  ° °IF you received labwork today, you will receive an invoice from [facility]. Please contact [facility] at [phone] with questions or concerns regarding your invoice.  ° °Our billing staff will not be able to assist you with questions regarding bills from these companies. ° °You will be contacted with the lab results as soon as they are available. The fastest way to get your results is to activate your My Chart account. Instructions are located on the last page of this paperwork. If you have not heard from us regarding the results in 2 weeks, please contact this office. °  ° ° ° °

## 2018-03-26 NOTE — ED Notes (Signed)
Ambulated Pt in hallway with pulse oximetry. Pt started with 99% O2 on room air. During ambulation after about 46ft Pt's Pulse oximetry dropped to 91% with a good wave form. Pt denied SOB during entire ambulation. After ambulation and sitting for about 1 minute Pt's O2 returned to 96%.

## 2018-03-26 NOTE — ED Notes (Signed)
Discharge instructions reviewed with patient. Patient verbalizes understanding. VSS.   

## 2018-03-27 ENCOUNTER — Telehealth: Payer: Self-pay | Admitting: Physician Assistant

## 2018-03-27 LAB — CMP14+EGFR
ALK PHOS: 53 IU/L (ref 39–117)
ALT: 11 IU/L (ref 0–44)
AST: 15 IU/L (ref 0–40)
Albumin/Globulin Ratio: 1.3 (ref 1.2–2.2)
Albumin: 3.9 g/dL (ref 3.6–4.8)
BUN / CREAT RATIO: 24 (ref 10–24)
BUN: 19 mg/dL (ref 8–27)
Bilirubin Total: <0.2 mg/dL (ref 0.0–1.2)
CO2: 21 mmol/L (ref 20–29)
CREATININE: 0.8 mg/dL (ref 0.76–1.27)
Calcium: 9.3 mg/dL (ref 8.6–10.2)
Chloride: 104 mmol/L (ref 96–106)
GFR, EST AFRICAN AMERICAN: 111 mL/min/{1.73_m2} (ref >59)
GFR, EST NON AFRICAN AMERICAN: 96 mL/min/{1.73_m2} (ref >59)
Globulin, Total: 3.1 g/dL (ref 1.5–4.5)
Glucose: 117 mg/dL — ABNORMAL HIGH (ref 65–99)
Potassium: 4.2 mmol/L (ref 3.5–5.2)
SODIUM: 142 mmol/L (ref 134–144)
TOTAL PROTEIN: 7 g/dL (ref 6.0–8.5)

## 2018-03-27 NOTE — Telephone Encounter (Signed)
Spoke to pharmacist at [facility] [address] Rx's were picked up 03/26/2018 at 3:52pm.  Included the neb solution. ????

## 2018-03-27 NOTE — Telephone Encounter (Signed)
Copied from CRM [id number]. Topic: General - Inquiry >> Mar 27, 2018  8:32 AM Lorayne Bender wrote: Reason for CRM:   Pt's wife, Felicity Coyer, calling.  States pt was prescribed a nebulizer yesterday but [facility]  Pt's wife would like to know where to go to get this at. Felicity Coyer can be reached at [phone] or [phone]

## 2018-03-28 ENCOUNTER — Telehealth: Payer: Self-pay

## 2018-03-28 NOTE — Telephone Encounter (Signed)
I have placed the order, it needs to be faxed to [facility]. Could you please do so? If you have any questions, please reach out to Remi Haggard.

## 2018-03-28 NOTE — Telephone Encounter (Signed)
Pt is in need of an order for nebulizer. The pharmacy does not sale the machines in the store. He is aware that he can get the device from a dme store

## 2018-03-30 ENCOUNTER — Other Ambulatory Visit: Payer: Self-pay | Admitting: Physician Assistant

## 2018-03-30 ENCOUNTER — Ambulatory Visit (INDEPENDENT_AMBULATORY_CARE_PROVIDER_SITE_OTHER): Payer: BLUE CROSS/BLUE SHIELD | Admitting: Internal Medicine

## 2018-03-30 ENCOUNTER — Encounter: Payer: Self-pay | Admitting: *Deleted

## 2018-03-30 ENCOUNTER — Encounter: Payer: Self-pay | Admitting: Internal Medicine

## 2018-03-30 VITALS — BP 144/90 | HR 88 | Temp 98.1°F | Ht 71.0 in | Wt 160.6 lb

## 2018-03-30 DIAGNOSIS — F17210 Nicotine dependence, cigarettes, uncomplicated: Secondary | ICD-10-CM | POA: Diagnosis not present

## 2018-03-30 DIAGNOSIS — J449 Chronic obstructive pulmonary disease, unspecified: Secondary | ICD-10-CM | POA: Diagnosis not present

## 2018-03-30 MED ORDER — TIOTROPIUM BROMIDE MONOHYDRATE 2.5 MCG/ACT IN AERS: 2.0000 | INHALATION_SPRAY | Freq: Every day | RESPIRATORY_TRACT | 11 refills | Status: DC

## 2018-03-30 MED ORDER — MOMETASONE FURO-FORMOTEROL FUM 200-5 MCG/ACT IN AERO: INHALATION_SPRAY | RESPIRATORY_TRACT | 11 refills | Status: DC

## 2018-03-30 MED ORDER — ALBUTEROL SULFATE (2.5 MG/3ML) 0.083% IN NEBU
2.5000 mg | INHALATION_SOLUTION | Freq: Once | RESPIRATORY_TRACT | Status: AC
  Administered 2018-03-30: 2.5 mg via RESPIRATORY_TRACT

## 2018-03-30 MED ORDER — AMOXICILLIN-POT CLAVULANATE 875-125 MG PO TABS: 1.0000 | ORAL_TABLET | Freq: Two times a day (BID) | ORAL | 0 refills | Status: AC

## 2018-03-30 MED ORDER — PREDNISONE 10 MG PO TABS: ORAL_TABLET | ORAL | 0 refills | Status: DC

## 2018-03-30 MED ORDER — ALL-IN-ONE NEBULIZER SYSTEM MISC: 1.0000 [IU] | 0 refills | Status: DC | PRN

## 2018-03-30 NOTE — Progress Notes (Signed)
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 Pulmonary Consult    Referred by Dr. Bobby Rumpf. Pt states dxed with COPD approx 3 yrs ago. He c/o increased DOE for the past 3 months. He gets SOB when he lifts things and when he gets in a hurry. He states he has also had trouble keeping up with yard work. He is also coughing more- prod with yellow to clear sputum.  Cough states cough sometimes wakes him up. He coughs until he is dizzy and feels faint.   was doing some better on  dulera /saba worse started back up one day prior to OV  And  Some better Am of ov dulera 100 x 2 puffs, no saba  Bad am cough x sev tbsp clear mucus am of ov  Doe= MMRC2 = can't walk a nl pace on a flat grade s sob but does fine slow and flat eg walking at [employer] ok  Sleeping poorly due to restless, not cough or sob  Prednisone really helps transiently  rec Plan A = Automatic =  dulera 100 Take 2 puffs first thing in am chase with the spiriva x 2 pffs  and then another 2 puffs of just the dulera about 12 hours later  Plan B = Backup Only use your albuterol as a rescue medication  The key is to stop smoking completely before smoking completely stops you! For cough > mucinex dm up to 1200 mg every 12 hours as needed   Please schedule a follow up office visit in 4 weeks, sooner if needed  with all medications /inhalers/ solutions in hand so we can verify exactly what you are taking. This includes all  medications from all doctors and over the counters    03/30/2018  Acute extended  ov/Nicholad Kautzman re: aecopd  X 3 months/maint on dulera 100/spiriva smi/ last worked Mar 23 2018  Chief Complaint  Patient presents with  . Acute Visit    Pt c/o increased SOB for the past 3 months. He is coughing up dark yellow to green sputum.  He also c/o wheezing and chest tightness.  He is using his ventolin about 4 x per day. He was recently prescribed nebs but not started on yet.   Dyspnea:  MMRC3 = can't walk 100 yards even at a slow pace at a flat grade s stopping due to sob   Cough:  Esp in am prod sev tbsp yellow mucus assoc with nasal congestion  Sleeping: on side . Bed flat / 2 pillows  SABA use: 4 puffs today last around 5 h prior to OV  / still very confused with how to use meds  02: none    No obvious day to day or daytime variability or assoc  mucus plugs or hemoptysis or cp  or overt  hb symptoms.    Also denies any obvious fluctuation  of symptoms with weather or environmental changes or other aggravating or alleviating factors except as outlined above   No unusual exposure hx or h/o childhood pna  or knowledge of premature birth.  Current Allergies, Complete Past Medical History, Past Surgical History, Family History, and Social History were reviewed in [employer] record.  ROS  The following are not active complaints unless bolded Hoarseness, sore throat, dysphagia, dental problems, itching, sneezing,  nasal congestion or discharge of excess mucus or purulent secretions, ear ache,   fever, chills, sweats, unintended wt loss or wt gain, classically pleuritic or exertional cp,  orthopnea pnd or arm/hand swelling  or leg swelling, presyncope, palpitations, abdominal pain, anorexia, nausea, vomiting, diarrhea  or change in bowel habits or change in bladder habits, change in stools or change in urine, dysuria, hematuria,  rash, arthralgias, visual complaints, headache, numbness,  weakness or ataxia or problems with walking or coordination,  change in mood or  memory.        Current Meds  Medication Sig  . albuterol (PROVENTIL HFA;VENTOLIN HFA) 108 (90 Base) MCG/ACT inhaler Inhale 2 puffs into the lungs every 6 (six) hours as needed. (Patient taking differently: Inhale 2 puffs into the lungs every 6 (six) hours as needed for wheezing or shortness of breath. )  . albuterol (PROVENTIL) (2.5 MG/3ML) 0.083% nebulizer solution Take 3 mLs (2.5 mg total) by nebulization every 6 (six) hours as needed for wheezing or shortness of breath.  . indomethacin (INDOCIN) 50 MG capsule TAKE 50 MG 3 TIMES DAILY ONLY WHEN NEEDED FOR FLARES OF GOUT (Patient taking differently: Take 50 mg by mouth 3 (three) times daily as needed (gout flares). )  . Ledipasvir-Sofosbuvir (HARVONI) 90-400 MG TABS Take 1 tablet by mouth daily.  . Melatonin Gummies 2.5 MG CHEW Chew 5 mg by mouth at bedtime.  . ondansetron (ZOFRAN ODT) 4 MG disintegrating tablet Take 1 tablet (4 mg total) by mouth every 8 (eight) hours as needed for nausea or vomiting.  . Tiotropium Bromide Monohydrate (SPIRIVA RESPIMAT) 2.5 MCG/ACT AERS Inhale 2 puffs into the lungs daily.  . [  mometasone-formoterol (DULERA) 100-5 MCG/ACT AERO INHALE 2 PUFFS INTO THE LUNGS 2 (TWO) TIMES DAILY (Patient taking differently: Inhale 2 puffs into the lungs 2 (two) times daily. )  . [ ]  Tiotropium Bromide Monohydrate (SPIRIVA RESPIMAT) 2.5 MCG/ACT AERS Inhale 2 puffs daily into the lungs.                                Objective:   Physical Exam   amb slt hoarse wm nad   03/30/2018     160   04/03/17 167 lb 6.4 oz (75.9 kg)  04/01/17 161 lb (73 kg)  04/18/14 179 lb 12.8 oz (81.6 kg)     Vital signs reviewed - Note on arrival 02 sats  97% on RA      HEENT: top denture/ nl lower dentition/ nl oropharynx. Nl external ear canals without cough reflex -  Mild bilateral non-specific turbinate edema     NECK :  without JVD/Nodes/TM/  nl carotid upstrokes bilaterally   LUNGS: no acc muscle use,  Mod barrel  contour chest wall with bilateral  Nearly pan exp rhonchi  and  without cough on insp or exp maneuver and mod  Hyperresonant  to  percussion bilaterally     CV:  RRR  no s3 or murmur or increase in P2,  and no edema   ABD:  soft and nontender with pos mid insp Hoover's  in the supine position. No bruits or organomegaly appreciated, bowel sounds nl  MS:   Nl gait/  ext warm without deformities, calf tenderness, cyanosis or clubbing No obvious joint restrictions   SKIN: warm and dry without lesions    NEURO:  alert, approp, nl sensorium with  no motor or cerebellar deficits apparent.          I personally reviewed images and agree with radiology impression as follows:  CXR:   03/24/18 Stable emphysematous changes but no acute overlying pulmonary process.         Assessment:

## 2018-03-30 NOTE — Progress Notes (Signed)
No orders of the defined types were placed in this encounter.

## 2018-03-30 NOTE — Patient Instructions (Addendum)
Plan A = Automatic = Dulera 100 (200 when this one is empty)  Take 2 puffs first thing in am chase with the spiriva x 2 pffs  and then another 2 puffs of just the dulera about 12 hours later   Prednisone 10 mg Take 4 for three days 3 for three days 2 for three days 1 for three days and stop   Augmentin 875 mg take one pill twice daily  X 10 days - take at breakfast and supper with large glass of water.  It would help reduce the usual side effects (diarrhea and yeast infections) if you ate cultured yogurt at lunch.    Plan B = Backup Only use your albuterol inhaler as a rescue medication to be used if you can't catch your breath by resting or doing a relaxed purse lip breathing pattern.  - The less you use it, the better it will work when you need it. - Ok to use the inhaler up to 2 puffs  every 4 hours if you must but call for appointment if use goes up over your usual need - Don't leave home without it !!  (think of it like the spare tire for your car)   Plan C = Crisis - only use your albuterol nebulizer if you first try Plan B and it fails to help > ok to use the nebulizer up to every 4 hours but if start needing it regularly call for immediate appointment   For cough mucinex dm up to 1200 mg every 12 hours as needed    The key is to stop smoking completely before smoking completely stops you!   Out of work until 04/06/18    Please schedule a follow up office visit in 4 weeks, sooner if needed  with all medications /inhalers/ solutions in hand so we can verify exactly what you are taking. This includes all medications from all doctors and over the counters - pfts on return

## 2018-03-31 ENCOUNTER — Telehealth: Payer: Self-pay | Admitting: Internal Medicine

## 2018-03-31 ENCOUNTER — Encounter: Payer: Self-pay | Admitting: Internal Medicine

## 2018-03-31 NOTE — Assessment & Plan Note (Signed)
4-5 min discussion re active cigarette smoking in addition to office E&M  Ask about tobacco use:   ongoing Advise quitting   I took an extended  opportunity with this patient to outline the consequences of continued cigarette use  in airway disorders based on all the data we have from the multiple national lung health studies (perfomed over decades at millions of dollars in cost)  indicating that smoking cessation, not choice of inhalers or physicians, is the most important aspect of his care.   Assess willingness:  Not committed at this point Assist in quit attempt:  Per PCP when ready Arrange follow up:   Follow up per Primary Care planned        I had an extended discussion with the patient reviewing all relevant studies completed to date and  lasting 25 minutes of a 40  minute acute visit to re-establish with me p one year's absence and multiple primary care visits which have not corrected  severe non-specific but potentially very serious refractory respiratory symptoms of uncertain and potentially multiple  etiologies.   Each maintenance medication was reviewed in detail including most importantly the difference between maintenance and prns and under what circumstances the prns are to be triggered using an action plan format that is not reflected in the computer generated alphabetically organized AVS.    Please see AVS for specific instructions unique to this office visit that I personally wrote and verbalized to the the pt in detail and then reviewed with pt  by my nurse highlighting any changes in therapy/plan of care  recommended at today's visit.      See device teaching which extended face to face time for this visit

## 2018-03-31 NOTE — Telephone Encounter (Signed)
[facility]

## 2018-03-31 NOTE — Assessment & Plan Note (Addendum)
Spirometry 04/03/2017  FEV1 2.05 (54%)  Ratio 52 with atypical curvature p am dulera 100 x 2 - 04/03/2017    add spiriva   - Alpha one AT  Screening 04/03/17 :   SS   Level 175  - 03/30/2018  After extensive coaching inhaler device,  effectiveness =    90% with hfa   Poor control x 3 months reportedly on maint with laba/lama/ics approp for GOLD II/ group D symptoms.  DDX of  difficult airways management almost all start with A and  include Adherence, Ace Inhibitors, Acid Reflux, Active Sinus Disease, Alpha 1 Antitripsin deficiency, Anxiety masquerading as Airways dz,  ABPA,  Allergy(esp in young), Aspiration (esp in elderly), Adverse effects of meds,  Active smoking or vaping, A bunch of PE's (a small clot burden can't cause this syndrome unless there is already severe underlying pulm or vascular dz with poor reserve) plus two Bs  = Bronchiectasis and Beta blocker use..and one C= CHF   Adherence is always the initial "prime suspect" and is a multilayered concern that requires a "trust but verify" approach in every patient - starting with knowing how to use medications, especially inhalers, correctly, keeping up with refills and understanding the fundamental difference between maintenance and prns vs those medications only taken for a very short course and then stopped and not refilled.  - still easily confused with names/ roles for various inhalers - see hfa teaching - Formulary restrictions will be an ongoing challenge for the forseable future and I would be happy to pick an alternative if the pt will first  provide me a list of them but pt  will need to return here for training for any new device that is required eg dpi vs hfa vs respimat.   In meantime we can always provide samples so the patient never runs out of any needed respiratory medications.  - return with all meds in hand using a trust but verify approach to confirm accurate Medication  Reconciliation The principal here is that until we are  certain that the  patients are doing what we've asked, it makes no sense to ask them to do more.    Active smoking top of the list of usual suspects  (see separate a/p)   ? Active/acute sinus dz > augmentin 10 days and low threshold for sinus ct if not better  ? Alpha one def > ruled out   ? [place]/ asthma > Pred x 12 day taper   >>> increase dulera to 200 2bid (consider symb 160 if not doing well and insurance will cover as dulera was never actually approved for copd)  >>>  rx with neb saba x one in office helped relieve acute sob  >>> f/u in 4 weeks, sooner if needed/ with out of work okayed to return Nov 18

## 2018-03-31 NOTE — Telephone Encounter (Signed)
Called and spoke to patient, patient states he has been out of work since Tuesday, he would like to get short term disability. He called [employer] and they stated that he needs a signed release of information form. I asked patient if he signed a release of information form, he states he did not and would like a blank one sent to his wife's fax number. Fax [phone]. Form faxed. Nothing further is needed at this time.

## 2018-04-08 ENCOUNTER — Telehealth: Payer: Self-pay | Admitting: Internal Medicine

## 2018-04-08 NOTE — Telephone Encounter (Signed)
Forms have been located and placed at AssurantLeslie's desk beside printer.

## 2018-04-10 ENCOUNTER — Ambulatory Visit: Payer: BLUE CROSS/BLUE SHIELD

## 2018-04-10 NOTE — Telephone Encounter (Signed)
Rec'd completed forms - fwd to [employer] via interoffice mail -pr  °

## 2018-04-10 NOTE — Telephone Encounter (Signed)
I have placed on Dr Thurston HoleWert's desk to be signed

## 2018-04-10 NOTE — Telephone Encounter (Signed)
Done and given to Patrice 

## 2018-04-10 NOTE — Telephone Encounter (Signed)
Done

## 2018-04-20 ENCOUNTER — Other Ambulatory Visit: Payer: Self-pay | Admitting: Emergency Medicine

## 2018-04-20 NOTE — Telephone Encounter (Signed)
Patient called, left VM to call [facility] about the refill, because it was sent on 03/30/18 with 11 refills by Dr. Sherene SiresWert. Advised if they did not receive the refill to call Dr. Thurston HoleWert's office.  [facility] called and [facility] [place], [facility] request. She says it was never received. I advised to contact Dr. Thurston HoleWert's office for the refill, because I am not in that office, she verbalized understanding and says she will reach out to them.

## 2018-04-24 ENCOUNTER — Ambulatory Visit (INDEPENDENT_AMBULATORY_CARE_PROVIDER_SITE_OTHER): Payer: BLUE CROSS/BLUE SHIELD | Admitting: Pharmacist

## 2018-04-24 DIAGNOSIS — B182 Chronic viral hepatitis C: Secondary | ICD-10-CM

## 2018-04-24 NOTE — Progress Notes (Signed)
HPI: Anthony Mayo is a 62 y.o. male who presents to the [facility] pharmacy clinic for follow-up of his Hepatitis C infection.  Medication: Harvoni x 12 weeks  Start Date: 01/10/18  Hepatitis C Genotype: 1a  Fibrosis Score: F2/F3  Hepatitis C RNA: 13.5 million 12/01/17, <15 on 02/11/18  Patient Active Problem List   Diagnosis Date Noted  . Chronic hepatitis C without hepatic coma (HCC) 11/08/2017  . Dizziness 10/24/2017  . Recurrent headache 10/24/2017  . Depression with anxiety 10/24/2017  . Cigarette smoker 04/04/2017  . Dyspnea on exertion 04/01/2017  . COPD   GOLD II/ still smoking 03/24/2012  . Asthma 03/19/2012    Patient's Medications  New Prescriptions   No medications on file  Previous Medications   ALBUTEROL (PROVENTIL HFA;VENTOLIN HFA) 108 (90 BASE) MCG/ACT INHALER    Inhale 2 puffs into the lungs every 6 (six) hours as needed.   ALBUTEROL (PROVENTIL) (2.5 MG/3ML) 0.083% NEBULIZER SOLUTION    Take 3 mLs (2.5 mg total) by nebulization every 6 (six) hours as needed for wheezing or shortness of breath.   INDOMETHACIN (INDOCIN) 50 MG CAPSULE    TAKE 50 MG 3 TIMES DAILY ONLY WHEN NEEDED FOR FLARES OF GOUT   IPRATROPIUM (ATROVENT) 0.02 % NEBULIZER SOLUTION    Take 2.5 mLs (0.5 mg total) by nebulization 4 (four) times daily.   LEDIPASVIR-SOFOSBUVIR (HARVONI) 90-400 MG TABS    Take 1 tablet by mouth daily.   MELATONIN GUMMIES 2.5 MG CHEW    Chew 5 mg by mouth at bedtime.   MOMETASONE-FORMOTEROL (DULERA) 200-5 MCG/ACT AERO    Take 2 puffs first thing in am and then another 2 puffs about 12 hours later.   ONDANSETRON (ZOFRAN ODT) 4 MG DISINTEGRATING TABLET    Take 1 tablet (4 mg total) by mouth every 8 (eight) hours as needed for nausea or vomiting.   PREDNISONE (DELTASONE) 10 MG TABLET    Take  4 each am x 2 days,   2 each am x 2 days,  1 each am x 2 days and stop   TIOTROPIUM BROMIDE MONOHYDRATE (SPIRIVA RESPIMAT) 2.5 MCG/ACT AERS    Inhale 2 puffs into the lungs daily.  Modified  Medications   No medications on file  Discontinued Medications   No medications on file    Allergies: No Known Allergies  Past Medical History: Past Medical History:  Diagnosis Date  . Asthma   . COPD (chronic obstructive pulmonary disease) (HCC)   . Gout     Social History: Social History   Socioeconomic History  . Marital status: Married    Spouse name: Not on file  . Number of children: 0  . Years of education: Not on file  . Highest education level: Not on file  Occupational History  . Not on file  Social Needs  . Financial resource strain: Not on file  . Food insecurity:    Worry: Not on file    Inability: Not on file  . Transportation needs:    Medical: Not on file    Non-medical: Not on file  Tobacco Use  . Smoking status: Current Every Day Smoker    Packs/day: 1.00    Years: 47.00    Pack years: 47.00    Types: Cigarettes  . Smokeless tobacco: Current User    Types: Snuff  Substance and Sexual Activity  . Alcohol use: Yes    Alcohol/week: 24.0 standard drinks    Types: 24 Cans of beer per  week  . Drug use: No  . Sexual activity: Yes    Partners: Female  Lifestyle  . Physical activity:    Days per week: Not on file    Minutes per session: Not on file  . Stress: Not on file  Relationships  . Social connections:    Talks on phone: Not on file    Gets together: Not on file    Attends religious service: Not on file    Active member of club or organization: Not on file    Attends meetings of clubs or organizations: Not on file    Relationship status: Not on file  Other Topics Concern  . Not on file  Social History Narrative  . Not on file    Labs: Hepatitis C Lab Results  Component Value Date   HCVGENOTYPE 1a 12/01/2017   HCVRNAPCRQN <15 NOT DETECTED 02/11/2018   HCVRNAPCRQN 13,500,000 (H) 12/01/2017   FIBROSTAGE F0 12/12/2017   Hepatitis B Lab Results  Component Value Date   HEPBSAB NON-REACTIVE 12/12/2017   HEPBSAG NON-REACTIVE  12/12/2017   HEPBCAB REACTIVE (A) 12/12/2017   Hepatitis A Lab Results  Component Value Date   HAV NON-REACTIVE 12/12/2017   HIV Lab Results  Component Value Date   HIV Non Reactive 10/24/2017   Lab Results  Component Value Date   CREATININE 0.80 03/26/2018   CREATININE 0.87 10/24/2017   CREATININE 1.10 04/03/2017   CREATININE 0.90 04/18/2014   CREATININE 0.96 03/24/2012   Lab Results  Component Value Date   AST 15 03/26/2018   AST 24 10/24/2017   AST 13 04/03/2017   ALT 11 03/26/2018   ALT 17 12/12/2017   ALT 20 10/24/2017   INR 1.91 (H) 12/14/2010   INR 1.41 12/13/2010   INR 1.08 12/12/2010    Assessment: Anthony Mayo is here today to follow-up for his Hep C infection. He started Harvoni for 12 weeks back in August but has had a lot of problems with adherence and obtaining his medication from [facility]. He has missed several weeks throughout the 3 months and some days here and there. He works 10 hour days and is rarely available for phone calls.  [facility] and it would not be there for 3-4 days. On the last shipment, his wife had to go [place] the airport distribution center to pick up his refill.  This is obviously not an ideal situation but there wasn't much we could do. He has 3 pills left until he has completed his 12 weeks. Luckily, his early on treatment Hep C viral load was undetectable.  We had a long discussion today regarding his Hep C and I apologized for [facility] not doing [facility] and being horrible to him. We also discussed his recent COPD exacerbation and the need for him to stop smoking. I told him we will hope for the best regarding his Hep C and he stated that he probably wouldn't want to do treatment again if he fails. I don't blame him, especially if he has to use [facility].  Let's hope he is cured this time around.  Will check labs today and have him back in 3 months for his cure visit.   Plan: -  Finish Harvoni - Hep C RNA today - F/u with me for cure visit 3/20 at 10am   L. , PharmD, BCIDP, AAHIVP, CPP Infectious Diseases Clinical Pharmacist [facility] 04/24/2018, 3:10 PM

## 2018-04-27 LAB — HEPATITIS C RNA QUANTITATIVE
HCV Quantitative Log: <1.18 Log IU/mL
HCV RNA, PCR, QN: <15 IU/mL

## 2018-05-05 ENCOUNTER — Ambulatory Visit (INDEPENDENT_AMBULATORY_CARE_PROVIDER_SITE_OTHER): Payer: BLUE CROSS/BLUE SHIELD | Admitting: Internal Medicine

## 2018-05-05 ENCOUNTER — Encounter: Payer: Self-pay | Admitting: Internal Medicine

## 2018-05-05 VITALS — BP 138/78 | HR 96 | Ht 72.0 in | Wt 167.7 lb

## 2018-05-05 DIAGNOSIS — F17210 Nicotine dependence, cigarettes, uncomplicated: Secondary | ICD-10-CM | POA: Diagnosis not present

## 2018-05-05 DIAGNOSIS — J449 Chronic obstructive pulmonary disease, unspecified: Secondary | ICD-10-CM

## 2018-05-05 LAB — PULMONARY FUNCTION TEST
DL/VA % PRED: 68 %
DL/VA: 3.22 ml/min/mmHg/L
DLCO UNC: 23.65 ml/min/mmHg
DLCO unc % pred: 67 %
FEF 25-75 POST: 1.18 L/s
FEF 25-75 PRE: 0.88 L/s
FEF2575-%Change-Post: 33 %
FEF2575-%Pred-Post: 38 %
FEF2575-%Pred-Pre: 28 %
FEV1-%Change-Post: 12 %
FEV1-%PRED-POST: 58 %
FEV1-%PRED-PRE: 51 %
FEV1-Post: 2.21 L
FEV1-Pre: 1.95 L
FEV1FVC-%CHANGE-POST: 3 %
FEV1FVC-%Pred-Pre: 65 %
FEV6-%Change-Post: 8 %
FEV6-%PRED-PRE: 80 %
FEV6-%Pred-Post: 88 %
FEV6-PRE: 3.89 L
FEV6-Post: 4.24 L
FEV6FVC-%Change-Post: 0 %
FEV6FVC-%PRED-PRE: 101 %
FEV6FVC-%Pred-Post: 102 %
FVC-%Change-Post: 8 %
FVC-%Pred-Post: 86 %
FVC-%Pred-Pre: 79 %
FVC-Post: 4.34 L
FVC-Pre: 4 L
POST FEV1/FVC RATIO: 51 %
PRE FEV6/FVC RATIO: 97 %
Post FEV6/FVC ratio: 98 %
Pre FEV1/FVC ratio: 49 %
RV % pred: 90 %
RV: 2.17 L
TLC % pred: 94 %
TLC: 6.97 L

## 2018-05-05 MED ORDER — MOMETASONE FURO-FORMOTEROL FUM 200-5 MCG/ACT IN AERO: INHALATION_SPRAY | RESPIRATORY_TRACT | 11 refills | Status: DC

## 2018-05-05 NOTE — Patient Instructions (Signed)
The key is to stop smoking completely before smoking completely stops you!   Please schedule a follow up visit in 6 months but call sooner if needed    

## 2018-05-05 NOTE — Progress Notes (Signed)
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 Pulmonary Consult    Referred by Dr. Bobby Rumpf. Pt states dxed with COPD approx 3 yrs ago. He c/o increased DOE for the past 3 months. He gets SOB when he lifts things and when he gets in a hurry. He states he has also had trouble keeping up with yard work. He is also coughing more- prod with yellow to clear sputum.  Cough states cough sometimes wakes him up. He coughs until he is dizzy and feels faint.   was doing some better on  dulera /saba worse started back up one day prior to OV  And  Some better Am of ov dulera 100 x 2 puffs, no saba  Bad am cough x sev tbsp clear mucus am of ov  Doe= MMRC2 = can't walk a nl pace on a flat grade s sob but does fine slow and flat eg walking at [employer] ok  Sleeping poorly due to restless, not cough or sob  Prednisone really helps transiently  rec Plan A = Automatic =  dulera 100 Take 2 puffs first thing in am chase with the spiriva x 2 pffs  and then another 2 puffs of just the dulera about 12 hours later  Plan B = Backup Only use your albuterol as a rescue medication  The key is to stop smoking completely before smoking completely stops you! For cough > mucinex dm up to 1200 mg every 12 hours as needed   Please schedule a follow up office visit in 4 weeks, sooner if needed  with all medications /inhalers/ solutions in hand so we can verify exactly what you are taking. This includes all  medications from all doctors and over the counters    03/30/2018  Acute extended  ov/Vineet Kinney re: aecopd  X 3 months/maint on dulera 100/spiriva smi/ last worked Mar 23 2018  Chief Complaint  Patient presents with  . Acute Visit    Pt c/o increased SOB for the past 3 months. He is coughing up dark yellow to green sputum.  He also c/o wheezing and chest tightness.  He is using his ventolin about 4 x per day. He was recently prescribed nebs but not started on yet.   Dyspnea:  MMRC3 = can't walk 100 yards even at a slow pace at a flat grade s stopping due to sob   Cough:  Esp in am prod sev tbsp yellow mucus assoc with nasal congestion  Sleeping: on side . Bed flat / 2 pillows  SABA use: 4 puffs today last around 5 h prior to OV  / still very confused with how to use meds  rec Plan A = Automatic = Dulera 100 (200 when this one is empty)  Take 2 puffs first thing in am chase with the spiriva x 2 pffs  and then another 2 puffs of  just the dulera about 12 hours later  Prednisone 10 mg Take 4 for three days 3 for three days 2 for three days 1 for three days and stop  Augmentin 875 mg take one pill twice daily  X 10 days - take at breakfast and supper with large glass of water.  It would help reduce the usual side effects (diarrhea and yeast infections) if you ate cultured yogurt at lunch.  Plan B = Backup Only use your albuterol inhaler as a rescue medication  Plan C = Crisis - only use your albuterol nebulizer if you first try Plan B and it fails to help > ok to use the nebulizer up to every 4 hours but if start needing it regularly call for immediate appointment For cough mucinex dm up to 1200 mg every 12 hours as needed  The key is to stop smoking completely before smoking completely stops you!  Out of work until 04/06/18  Please schedule a follow up office visit in 4 weeks, sooner if needed  with all medications /inhalers/ solutions in hand so we can verify exactly what you are taking. This includes  all medications from all doctors and over the counters - pfts on return      05/05/2018  f/u ov/Lionel Woodberry re: copd gold II / still smoking maint on dulera 200/spiriva  Chief Complaint  Patient presents with  . Follow-up    follows for COPD. breathing doing well. coughs some. PFT's done today. He rarely uses his albuterol inhaler or neb.   Dyspnea:  MMRC2 = can't walk a nl pace on a flat grade s sob but does fine slow and flat Cough: esp in am mucoid to slt green  Sleeping: able to lie flat SABA use: rarely  02: none    No obvious day to day or daytime variability or assoc excess/ purulent sputum or mucus plugs or hemoptysis or cp or chest tightness, subjective wheeze or overt sinus or hb symptoms.   Sleeping as above  without nocturnal  or early am exacerbation  of respiratory  c/o's or need for noct saba. Also denies any obvious fluctuation of symptoms with weather or environmental changes or other aggravating or alleviating factors except as outlined above   No unusual exposure hx or h/o childhood pna/ asthma or knowledge of premature birth.  Current Allergies, Complete Past Medical History, Past Surgical History, Family History, and Social History were reviewed in [facility] electronic [facility] record.  ROS  The following are not active complaints unless bolded Hoarseness, sore throat, dysphagia, dental problems, itching, sneezing,  nasal congestion or discharge of excess mucus or purulent secretions, ear ache,   fever, chills, sweats, unintended wt loss or wt gain, classically pleuritic or exertional cp,  orthopnea pnd or arm/hand swelling  or leg swelling, presyncope, palpitations, abdominal pain, anorexia, nausea, vomiting, diarrhea  or change in bowel habits or change in bladder habits, change in stools or change in urine, dysuria, hematuria,  rash, arthralgias, visual complaints, headache, numbness, weakness or ataxia or problems with walking or coordination,  change in mood or   memory.        Current Meds  Medication Sig  . albuterol (PROVENTIL HFA;VENTOLIN HFA) 108 (90 Base) MCG/ACT inhaler Inhale 2 puffs into the lungs every 6 (six) hours as needed. (Patient taking differently: Inhale 2 puffs into the lungs every 6 (six) hours as needed for wheezing or shortness of breath. )  . albuterol (PROVENTIL) (2.5 MG/3ML) 0.083% nebulizer solution Take  3 mLs (2.5 mg total) by nebulization every 6 (six) hours as needed for wheezing or shortness of breath.  . indomethacin (INDOCIN) 50 MG capsule TAKE 50 MG 3 TIMES DAILY ONLY WHEN NEEDED FOR FLARES OF GOUT (Patient taking differently: Take 50 mg by mouth 3 (three) times daily as needed (gout flares). )  . Ledipasvir-Sofosbuvir (HARVONI) 90-400 MG TABS Take 1 tablet by mouth daily.  . Melatonin Gummies 2.5 MG CHEW Chew 5 mg by mouth at bedtime.  . mometasone-formoterol (DULERA) 200-5 MCG/ACT AERO Take 2 puffs first thing in am and then another 2 puffs about 12 hours later.  . Tiotropium Bromide Monohydrate (SPIRIVA RESPIMAT) 2.5 MCG/ACT AERS Inhale 2 puffs into the lungs daily.  .     .                   Objective:   Physical Exam    amb wm nad/ voice texture good    05/05/2018     167 03/30/2018     160   04/03/17 167 lb 6.4 oz (75.9 kg)  04/01/17 161 lb (73 kg)  04/18/14 179 lb 12.8 oz (81.6 kg)    Vital signs reviewed - Note on arrival 02 sats  96% on RA    HEENT: top denture/ lower dentition poor / nl oropharynx. Nl external ear canals without cough reflex -  Mild bilateral non-specific turbinate edema     NECK :  without JVD/Nodes/TM/ nl carotid upstrokes bilaterally   LUNGS: no acc muscle use,  Mildd barrel  contour chest wall with bilateral  Distant bs s audible wheeze and  without cough on insp or exp maneuver and mild  Hyperresonant  to  percussion bilaterally     CV:  RRR  no s3 or murmur or increase in P2, and no edema   ABD:  soft and nontender with pos mid/late  insp Hoover's  in the supine  position. No bruits or organomegaly appreciated, bowel sounds nl  MS:   Nl gait/  ext warm without deformities, calf tenderness, cyanosis or clubbing No obvious joint restrictions   SKIN: warm and dry without lesions    NEURO:  alert, approp, nl sensorium with  no motor or cerebellar deficits apparent.         Assessment:

## 2018-05-05 NOTE — Progress Notes (Signed)
pft completed 05/05/18  

## 2018-05-05 NOTE — Assessment & Plan Note (Addendum)
Spirometry 04/03/2017  FEV1 2.05 (54%)  Ratio 52 with atypical curvature p am dulera 100 x2 - 04/03/2017    add spiriva   - Alpha one AT  Screening 04/03/17 :   SS   Level 175  - 03/30/2018  After extensive coaching inhaler device,  effectiveness =    90% with hfa   - PFT's  05/05/2018  FEV1 2.21 (58 % ) ratio 51   p 12  % improvement from saba p dulera 200/spiriva prior to study with DLCO  67  % corrects to 68 % for alv volume      Group D in terms of symptom/risk and laba/lama/ICS  therefore appropriate rx at this point and only thing to add to the mix is clear air (see separate a/p)    >> no change meds/ f/u q 6 m

## 2018-05-06 ENCOUNTER — Encounter: Payer: Self-pay | Admitting: Internal Medicine

## 2018-05-06 NOTE — Assessment & Plan Note (Signed)
Counseled re importance of smoking cessation but did not meet time criteria for separate billing      I reviewed the Fletcher curve with the patient that basically indicates  if you quit smoking when your best day FEV1 is still   preserved (as is only relatively the case here)  it is highly unlikely you will progress to severe or endstage disease and informed the patient there was  no medication on the market that has proven to alter the curve/ its downward trajectory  or the likelihood of progression of their disease(unlike other chronic medical conditions such as atheroclerosis where we do think we can change the natural hx with risk reducing meds)    Therefore stopping smoking and maintaining abstinence are  the most important aspects of his care, not choice of inhalers or for that matter, doctors.   Treatment other than smoking cessation  is entirely directed by severity of symptoms and focused also on reducing exacerbations, not attempting to change the natural history of the disease.   See copd a/p    I had an extended discussion with the patient reviewing all relevant studies completed to date and  lasting 15 to 20 minutes of a 25 minute visit    Each maintenance medication was reviewed in detail including most importantly the difference between maintenance and prns and under what circumstances the prns are to be triggered using an action plan format that is not reflected in the computer generated alphabetically organized AVS.     Please see AVS for specific instructions unique to this visit that I personally wrote and verbalized to the the pt in detail and then reviewed with pt  by my nurse highlighting any  changes in therapy recommended at today's visit to their plan of care.

## 2018-08-07 ENCOUNTER — Ambulatory Visit: Payer: BLUE CROSS/BLUE SHIELD | Admitting: Pharmacist

## 2018-08-24 ENCOUNTER — Telehealth: Payer: Self-pay | Admitting: Family Medicine

## 2018-08-24 NOTE — Telephone Encounter (Signed)
Copied from CRM [id number]. Topic: General - Other >> Aug 24, 2018  3:25 PM Floria Raveling A wrote: Reason for CRM:  Pt called in and stated that he is currently having a flare up of the Ghout.  He stated that the indomethacin (INDOCIN) 50 MG capsule [[id number]]  is not helping it.  He has taken it for 8 days and it still has not gotten any better.  He would like to know if something else could be called in help with the flare up  Best number  [phone] Pharmacy [id number] [place], [place] - [address] [phone] (Phone)

## 2018-08-25 NOTE — Telephone Encounter (Signed)
Called Anthony Mayo and LVM to call office back about medication request. Anthony Mayo needs to schedule a Tel-med appointment with Dr. Leretha Pol for follow-up Gout.

## 2018-08-27 ENCOUNTER — Telehealth: Payer: Self-pay | Admitting: Internal Medicine

## 2018-08-27 NOTE — Telephone Encounter (Signed)
ATC pt, no answer. Left message for pt to call back.    Assessment & Plan Note by Nyoka Cowden, MD at 05/06/2018 6:46 AM  Author: Nyoka Cowden, MD Author Type: Physician Filed: 05/06/2018 6:48 AM  Note Status: Written Cosign: Cosign Not Required Encounter Date: 05/05/2018  Problem: Cigarette smoker  Editor: Nyoka Cowden, MD (Physician)    Counseled re importance of smoking cessation but did not meet time criteria for separate billing      I reviewed the Fletcher curve with the patient that basically indicates  if you quit smoking when your best day FEV1 is still   preserved (as is only relatively the case here)  it is highly unlikely you will progress to severe or endstage disease and informed the patient there was  no medication on the market that has proven to alter the curve/ its downward trajectory  or the likelihood of progression of their disease(unlike other chronic medical conditions such as atheroclerosis where we do think we can change the natural hx with risk reducing meds)    Therefore stopping smoking and maintaining abstinence are  the most important aspects of his care, not choice of inhalers or for that matter, doctors.   Treatment other than smoking cessation  is entirely directed by severity of symptoms and focused also on reducing exacerbations, not attempting to change the natural history of the disease.   See copd a/p    I had an extended discussion with the patient reviewing all relevant studies completed to date and  lasting 15 to 20 minutes of a 25 minute visit    Each maintenance medication was reviewed in detail including most importantly the difference between maintenance and prns and under what circumstances the prns are to be triggered using an action plan format that is not reflected in the computer generated alphabetically organized AVS.     Please see AVS for specific instructions unique to this visit that I personally wrote and verbalized to  the the pt in detail and then reviewed with pt  by my nurse highlighting any  changes in therapy recommended at today's visit to their plan of care.      Assessment & Plan Note by Nyoka Cowden, MD at 05/05/2018 1:38 PM

## 2018-08-31 ENCOUNTER — Telehealth (INDEPENDENT_AMBULATORY_CARE_PROVIDER_SITE_OTHER): Payer: BLUE CROSS/BLUE SHIELD | Admitting: Emergency Medicine

## 2018-08-31 ENCOUNTER — Other Ambulatory Visit: Payer: Self-pay

## 2018-08-31 ENCOUNTER — Encounter: Payer: Self-pay | Admitting: Emergency Medicine

## 2018-08-31 DIAGNOSIS — M109 Gout, unspecified: Secondary | ICD-10-CM | POA: Diagnosis not present

## 2018-08-31 MED ORDER — ALLOPURINOL 100 MG PO TABS: 100.0000 mg | ORAL_TABLET | Freq: Every day | ORAL | 6 refills | Status: DC

## 2018-08-31 MED ORDER — COLCHICINE 0.6 MG PO TABS: ORAL_TABLET | ORAL | 1 refills | Status: DC

## 2018-08-31 MED ORDER — PREDNISONE 20 MG PO TABS: 40.0000 mg | ORAL_TABLET | Freq: Every day | ORAL | 0 refills | Status: AC

## 2018-08-31 NOTE — Progress Notes (Signed)
Called patient to triage for Telemed appointment. Patient complaining of gout for 3 weeks in the right hand on his 3rd finger with swelling and pain. Patient states indomethacin is not working.

## 2018-08-31 NOTE — Progress Notes (Signed)
Telemedicine Encounter- SOAP NOTE Established Patient  This telephone encounter was conducted with the patient's (or proxy's) verbal consent via audio telecommunications: yes/no: Yes Patient was instructed to have this encounter in a suitably private space; and to only have persons present to whom they give permission to participate. In addition, patient identity was confirmed by use of name plus two identifiers (DOB and address).  I discussed the limitations, risks, security and privacy concerns of performing an evaluation and management service by telephone and the availability of in person appointments. I also discussed with the patient that there may be a patient responsible charge related to this service. The patient expressed understanding and agreed to proceed.  I spent a total of TIME; 0 MIN TO 60 MIN: 20 minutes talking with the patient or their proxy.  No chief complaint on file. Gout to middle finger of right hand  Subjective   Anthony Mayo is a 63 y.o. male established patient. Telephone visit today for gout management.  Has history of gout with elevated uric acid levels.  Complaining of atypical gout attack to middle finger of his right hand.  Has been taken indomethacin with minimal relief.  Denies any injuries.  Denies infection.  No open wounds or scrapes to his finger.  No other significant symptoms. Has a history of COPD, recently saw pulmonary doctor, started on multiple inhalers.  Continues to smoke.  HPI   Patient Active Problem List   Diagnosis Date Noted  . Chronic hepatitis C without hepatic coma (HCC) 11/08/2017  . Recurrent headache 10/24/2017  . Depression with anxiety 10/24/2017  . Cigarette smoker 04/04/2017  . Dyspnea on exertion 04/01/2017  . COPD   GOLD II/ still smoking 03/24/2012  . Asthma 03/19/2012    Past Medical History:  Diagnosis Date  . Asthma   . COPD (chronic obstructive pulmonary disease) (HCC)   . Gout     Current Outpatient  Medications  Medication Sig Dispense Refill  . albuterol (PROVENTIL HFA;VENTOLIN HFA) 108 (90 Base) MCG/ACT inhaler Inhale 2 puffs into the lungs every 6 (six) hours as needed. (Patient taking differently: Inhale 2 puffs into the lungs every 6 (six) hours as needed for wheezing or shortness of breath. ) 1 Inhaler 0  . albuterol (PROVENTIL) (2.5 MG/3ML) 0.083% nebulizer solution Take 3 mLs (2.5 mg total) by nebulization every 6 (six) hours as needed for wheezing or shortness of breath. 150 mL 1  . indomethacin (INDOCIN) 50 MG capsule TAKE 50 MG 3 TIMES DAILY ONLY WHEN NEEDED FOR FLARES OF GOUT (Patient taking differently: Take 50 mg by mouth 3 (three) times daily as needed (gout flares). ) 30 capsule 0  . ipratropium (ATROVENT) 0.02 % nebulizer solution Take 2.5 mLs (0.5 mg total) by nebulization 4 (four) times daily. 75 mL 1  . Melatonin Gummies 2.5 MG CHEW Chew 5 mg by mouth at bedtime.    . mometasone-formoterol (DULERA) 200-5 MCG/ACT AERO Take 2 puffs first thing in am and then another 2 puffs about 12 hours later. 1 Inhaler 11  . ondansetron (ZOFRAN ODT) 4 MG disintegrating tablet Take 1 tablet (4 mg total) by mouth every 8 (eight) hours as needed for nausea or vomiting. 30 tablet 2  . Tiotropium Bromide Monohydrate (SPIRIVA RESPIMAT) 2.5 MCG/ACT AERS Inhale 2 puffs into the lungs daily. 1 Inhaler 11  . Ledipasvir-Sofosbuvir (HARVONI) 90-400 MG TABS Take 1 tablet by mouth daily. (Patient not taking: Reported on 08/31/2018) 28 tablet 2   No current facility-administered  medications for this visit.     No Known Allergies  Social History   Socioeconomic History  . Marital status: Married    Spouse name: Not on file  . Number of children: 0  . Years of education: Not on file  . Highest education level: Not on file  Occupational History  . Not on file  Social Needs  . Financial resource strain: Not on file  . Food insecurity:    Worry: Not on file    Inability: Not on file  .  Transportation needs:    Medical: Not on file    Non-medical: Not on file  Tobacco Use  . Smoking status: Current Every Day Smoker    Packs/day: 1.00    Years: 47.00    Pack years: 47.00    Types: Cigarettes  . Smokeless tobacco: Current User    Types: Snuff  Substance and Sexual Activity  . Alcohol use: Yes    Alcohol/week: 24.0 standard drinks    Types: 24 Cans of beer per week  . Drug use: No  . Sexual activity: Yes    Partners: Female  Lifestyle  . Physical activity:    Days per week: Not on file    Minutes per session: Not on file  . Stress: Not on file  Relationships  . Social connections:    Talks on phone: Not on file    Gets together: Not on file    Attends religious service: Not on file    Active member of club or organization: Not on file    Attends meetings of clubs or organizations: Not on file    Relationship status: Not on file  . Intimate partner violence:    Fear of current or ex partner: Not on file    Emotionally abused: Not on file    Physically abused: Not on file    Forced sexual activity: Not on file  Other Topics Concern  . Not on file  Social History Narrative  . Not on file    Review of Systems  Constitutional: Negative.  Negative for chills and fever.  HENT: Negative for congestion and sore throat.   Eyes: Negative.   Respiratory: Negative for cough and shortness of breath.   Cardiovascular: Negative.  Negative for chest pain and palpitations.  Gastrointestinal: Negative for abdominal pain, diarrhea, nausea and vomiting.  Musculoskeletal: Positive for joint pain.  Skin: Negative.  Negative for rash.  Neurological: Negative for dizziness and headaches.  Endo/Heme/Allergies: Negative.   All other systems reviewed and are negative.   Objective   Vitals as reported by the patient: None available There were no vitals filed for this visit.  There are no diagnoses linked to this encounter.  Diagnoses and all orders for this visit:   Acute gouty arthritis -     colchicine 0.6 MG tablet; Take 1.2 mg now and 0.6 mg one hour later. Take 0.6 mg daily after that x 5 days. -     allopurinol (ZYLOPRIM) 100 MG tablet; Take 1 tablet (100 mg total) by mouth daily. -     predniSONE (DELTASONE) 20 MG tablet; Take 2 tablets (40 mg total) by mouth daily with breakfast for 5 days.    I discussed the assessment and treatment plan with the patient. The patient was provided an opportunity to ask questions and all were answered. The patient agreed with the plan and demonstrated an understanding of the instructions.   The patient was advised to call back  or seek an in-person evaluation if the symptoms worsen or if the condition fails to improve as anticipated.  I provided 20 minutes of non-face-to-face time during this encounter.  Horald Pollen, MD  Primary Care at [facility]

## 2018-08-31 NOTE — Telephone Encounter (Signed)
ATC line busy will try again.ss

## 2018-09-01 ENCOUNTER — Telehealth: Payer: Self-pay | Admitting: Emergency Medicine

## 2018-09-01 NOTE — Telephone Encounter (Signed)
Unable to reach by phone.  Left message to call back re: refills for [facility] and [facility].

## 2018-09-01 NOTE — Telephone Encounter (Signed)
Copied from CRM [id number]. Topic: Quick Communication - See Telephone Encounter >> Sep 01, 2018  3:49 PM Aretta Nip wrote: CRM for notification. See Telephone encounter for: 09/01/18.colchicine 0.6 MG tablet This med was prescribed by Dr Kathie Rhodes and the pharmacy states that is needs a Prior App. Please send PA to [id number] - [place], [place] - [address] AT [facility] OF [address] & [facility] [place] [phone] (Phone) [phone] (Fax) They have given him 5 pills for the process.

## 2018-09-02 MED ORDER — MOMETASONE FURO-FORMOTEROL FUM 200-5 MCG/ACT IN AERO: INHALATION_SPRAY | RESPIRATORY_TRACT | 11 refills | Status: DC

## 2018-09-02 MED ORDER — TIOTROPIUM BROMIDE MONOHYDRATE 2.5 MCG/ACT IN AERS: 2.0000 | INHALATION_SPRAY | Freq: Every day | RESPIRATORY_TRACT | 11 refills | Status: DC

## 2018-09-02 NOTE — Telephone Encounter (Signed)
LMOM that rxs were sent OK per [facility]

## 2018-09-02 NOTE — Telephone Encounter (Signed)
Colchicine was denied by his insurance company

## 2018-09-02 NOTE — Telephone Encounter (Signed)
Please advise 

## 2018-09-04 NOTE — Telephone Encounter (Signed)
The treatment for acute inflammation of gout is glucocorticoids, NSAIDs, or colchicine.  He has been taken indomethacin and I started him on oral prednisone with colchicine.  Really nothing else he can take except Tylenol for pain in addition to his present medications.  Thanks.

## 2018-09-04 NOTE — Telephone Encounter (Signed)
Informed pt that colchicine was denied by his insurance, he would like to know is there anything else he can take for his Gout?

## 2018-09-04 NOTE — Telephone Encounter (Signed)
Pt was informed.

## 2018-09-14 ENCOUNTER — Other Ambulatory Visit: Payer: Self-pay | Admitting: Physician Assistant

## 2018-09-14 DIAGNOSIS — J441 Chronic obstructive pulmonary disease with (acute) exacerbation: Secondary | ICD-10-CM

## 2018-09-14 NOTE — Telephone Encounter (Signed)
Last ordered under brittany. Please advise

## 2018-09-18 ENCOUNTER — Telehealth: Payer: Self-pay | Admitting: Emergency Medicine

## 2018-09-18 NOTE — Telephone Encounter (Signed)
Copied from CRM [id number]. Topic: General - Other >> Sep 18, 2018 12:56 PM Percival Spanish wrote:  Pt insurance will not cover colchicine 0.6 MG tablet   but will cover [facility]    Pharmacy [facility] [place]

## 2018-09-18 NOTE — Telephone Encounter (Signed)
Please set up peer to peer. Thank you!

## 2018-09-18 NOTE — Telephone Encounter (Signed)
Please change if appropriate.

## 2018-09-18 NOTE — Telephone Encounter (Signed)
Pt's wife Felicity Coyer asked if Dr. Alvy Bimler could contact pt's insurance [employer] and request a peer to peer. Please advise.

## 2018-09-21 ENCOUNTER — Other Ambulatory Visit: Payer: Self-pay | Admitting: Emergency Medicine

## 2018-09-21 MED ORDER — COLCHICINE 0.6 MG PO CAPS: 0.6000 mg | ORAL_CAPSULE | Freq: Every day | ORAL | 1 refills | Status: DC

## 2018-09-21 NOTE — Telephone Encounter (Signed)
Changed.  Thanks.

## 2018-09-23 NOTE — Telephone Encounter (Signed)
The provider has to set up the peer to peer by calling the insurance company.

## 2018-09-24 NOTE — Telephone Encounter (Signed)
Get me the information and phone number and I will set it up when I can.

## 2018-09-24 NOTE — Telephone Encounter (Signed)
[employer] their number is [phone]

## 2018-09-24 NOTE — Telephone Encounter (Signed)
Please set up peer to peer thank you

## 2018-09-25 NOTE — Telephone Encounter (Signed)
This is not the correct number.  Please give me the number for prior authorizations so I can talk to a clinical person.  Thanks.

## 2018-10-06 ENCOUNTER — Other Ambulatory Visit: Payer: Self-pay | Admitting: Internal Medicine

## 2018-10-06 ENCOUNTER — Telehealth: Payer: Self-pay | Admitting: Internal Medicine

## 2018-10-06 DIAGNOSIS — J441 Chronic obstructive pulmonary disease with (acute) exacerbation: Secondary | ICD-10-CM

## 2018-10-06 MED ORDER — ALBUTEROL SULFATE HFA 108 (90 BASE) MCG/ACT IN AERS: INHALATION_SPRAY | RESPIRATORY_TRACT | 1 refills | Status: DC

## 2018-10-06 MED ORDER — TIOTROPIUM BROMIDE MONOHYDRATE 2.5 MCG/ACT IN AERS: 2.0000 | INHALATION_SPRAY | Freq: Every day | RESPIRATORY_TRACT | 2 refills | Status: DC

## 2018-10-06 MED ORDER — MOMETASONE FURO-FORMOTEROL FUM 200-5 MCG/ACT IN AERO: INHALATION_SPRAY | RESPIRATORY_TRACT | 2 refills | Status: DC

## 2018-10-06 NOTE — Addendum Note (Signed)
Addended by: Janean Sark on: 10/06/2018 12:19 PM   Modules accepted: Orders

## 2018-10-06 NOTE — Telephone Encounter (Signed)
Contacted patient was disconnected Pharmacy updated Made pt aware if he still has refill then he can have pharmacy transfer rx in which he does. He will have his spouse call office as there is a message in on her as well. Will close this encounter.

## 2018-10-14 ENCOUNTER — Other Ambulatory Visit: Payer: Self-pay | Admitting: Emergency Medicine

## 2018-10-14 DIAGNOSIS — J441 Chronic obstructive pulmonary disease with (acute) exacerbation: Secondary | ICD-10-CM

## 2018-10-20 ENCOUNTER — Telehealth: Payer: Self-pay | Admitting: Emergency Medicine

## 2018-10-20 NOTE — Telephone Encounter (Signed)
Copied from CRM [id number]. Topic: Quick Communication - Rx Refill/Question >> Oct 20, 2018 11:24 AM Richarda Blade wrote: Medication: mometasone-formoterol (DULERA) 200-5 MCG/ACT AERO  Tiotropium Bromide Monohydrate (SPIRIVA RESPIMAT) 2.5 MCG/ACT AERS   Has the patient contacted their pharmacy? No. (Agent: If no, request that the patient contact the pharmacy for the refill.) The patient called to get a medication refill   Preferred Pharmacy (with phone number or street name): [id number] [place], [place] - [address] AT [facility] OF [facility] & [address] [phone] (Phone) [phone] (Fax)    Agent: Please be advised that RX refills may take up to 3 business days. We ask that you follow-up with your pharmacy.

## 2018-10-20 NOTE — Telephone Encounter (Signed)
Anthony Mayo has been sent to pharmacy on 10/06/18 by Dr. Sherene Sires.

## 2018-11-06 ENCOUNTER — Ambulatory Visit: Payer: BLUE CROSS/BLUE SHIELD | Admitting: Internal Medicine

## 2018-12-03 NOTE — Progress Notes (Signed)
 @Patient  ID: Anthony KingfisherHarry Mayo, male    DOB: 02/29/1956, 63 y.o.   MRN: [id number]  Chief Complaint  Patient presents with  . Follow-up    COPD follow-up, currently smoking    Referring provider: Georgina QuintSagardia, Miguel Jose, *  HPI:  63 year old male current every day smoker followed in our office for COPD  PMH: Headache, depression with anxiety, chronic hep C Smoker/ Smoking History: Current every day smoker. 1 ppd. 47 ppd.  Maintenance:  Dulera 200, Spiriva 2.5 Pt of: Dr. Sherene SiresWert  12/04/2018  - Visit   63 year old male current every day smoker followed in our office for COPD.  Patient is maintained on Dulera 200 as well as Spiriva Respimat 2.5.  Patient reports he recently retired a month ago.  He has been doing more yard work and housework.  He reports he gets short of breath at times doing that.  He has to stop and catch his breath.  He is still smoking 1 pack/day.  He is interested in stopping smoking but he has not set a quit date and does not feel like he is ready to do that at this time.  MMRC - Breathlessness Score 2 - on level ground, I walk slower than people of the same age because of breathlessness, or have to stop for breathe when walking to my own pace   Tests:   03/24/2018-chest x-ray-Stable emphysematous changes but no acute overlay or pulmonary process  05/05/2018-pulmonary function test- FVC 4 (79% predicted), postbronchodilator ratio 51, postbronchodilator FEV1 2.21 (58% predicted), bronchodilator response with FEV1, mid flow reversibility, DLCO 67  FENO:  No results found for: NITRICOXIDE  PFT: PFT Results Latest Ref Rng & Units 05/05/2018  FVC-Pre L 4.00  FVC-Predicted Pre % 79  FVC-Post L 4.34  FVC-Predicted Post % 86  Pre FEV1/FVC % % 49  Post FEV1/FCV % % 51  FEV1-Pre L 1.95  FEV1-Predicted Pre % 51  FEV1-Post L 2.21  DLCO UNC% % 67  DLCO COR %Predicted % 68  TLC L 6.97  TLC % Predicted % 94  RV % Predicted % 90    Imaging: No results found.     Specialty Problems      Pulmonary Problems   Asthma   COPD   GOLD II/ still smoking    Spirometry 04/03/2017  FEV1 2.05 (54%)  Ratio 52 with atypical curvature p am dulera 100 x2 - 04/03/2017    add spiriva   - Alpha one AT  Screening 04/03/17 :   SS   Level 175  - 03/30/2018  After extensive coaching inhaler device,  effectiveness =    90% with hfa  - PFT's  05/05/2018  FEV1 2.21 (58 % ) ratio 51   p 12  % improvement from saba p dulera 200/spiriva prior to study with DLCO  67  % corrects to 68 % for alv volume        Dyspnea on exertion   COPD exacerbation (HCC)      No Known Allergies  Immunization History  Administered Date(s) Administered  . Influenza Split 03/24/2012  . Influenza,inj,Quad PF,6+ Mos 04/18/2014  . Pneumococcal Polysaccharide-23 02/11/2018   Needs flu vaccine in the fall  Past Medical History:  Diagnosis Date  . Asthma   . COPD (chronic obstructive pulmonary disease) (HCC)   . Gout     Tobacco History: Social History   Tobacco Use  Smoking Status Current Every Day Smoker  . Packs/day: 1.00  . Years: 47.00  .  Pack years: 47.00  . Types: Cigarettes  Smokeless Tobacco Current User  . Types: Snuff   Ready to quit: No Counseling given: Yes  Smoking assessment and cessation counseling  Patient currently smoking: 1 ppd  I have advised the patient to quit/stop smoking as soon as possible due to high risk for multiple medical problems.  It will also be very difficult for us to manage patient's  respiratory symptoms and status if we continue to expose her lungs to a known irritant.  We do not advise e-cigarettes as a form of stopping smoking.  Patient is not willing to quit smoking. Hasnt set quit date.   I have advised the patient that we can assist and have options of nicotine replacement therapy, provided smoking cessation education today, provided smoking cessation counseling, and provided cessation resources. Used patches and chantix before.  Referral to lung cancer screening program.  Potentially may be willing to consider nicotine patches and nicotine lozenges in the future not at this time.  Follow-up next office visit office visit for assessment of smoking cessation.  Smoking cessation counseling advised for: 5min    Outpatient Encounter Medications as of 12/04/2018  Medication Sig  . albuterol (VENTOLIN HFA) 108 (90 Base) MCG/ACT inhaler TAKE 2 PUFFS BY MOUTH EVERY 6 HOURS AS NEEDED  . allopurinol (ZYLOPRIM) 100 MG tablet Take 1 tablet (100 mg total) by mouth daily.  . Colchicine (MITIGARE) 0.6 MG CAPS Take 0.6 mg by mouth daily.  . indomethacin (INDOCIN) 50 MG capsule TAKE 50 MG 3 TIMES DAILY ONLY WHEN NEEDED FOR FLARES OF GOUT (Patient taking differently: Take 50 mg by mouth 3 (three) times daily as needed (gout flares). )  . ipratropium (ATROVENT) 0.02 % nebulizer solution Take 2.5 mLs (0.5 mg total) by nebulization 4 (four) times daily.  . Ledipasvir-Sofosbuvir (HARVONI) 90-400 MG TABS Take 1 tablet by mouth daily.  . Melatonin Gummies 2.5 MG CHEW Chew 5 mg by mouth at bedtime.  . mometasone-formoterol (DULERA) 200-5 MCG/ACT AERO Take 2 puffs first thing in am and then another 2 puffs about 12 hours later.  . ondansetron (ZOFRAN ODT) 4 MG disintegrating tablet Take 1 tablet (4 mg total) by mouth every 8 (eight) hours as needed for nausea or vomiting.  . Tiotropium Bromide Monohydrate (SPIRIVA RESPIMAT) 2.5 MCG/ACT AERS Inhale 2 puffs into the lungs daily.  Marland Kitchen. doxycycline (VIBRA-TABS) 100 MG tablet Take 1 tablet (100 mg total) by mouth 2 (two) times daily.  . predniSONE (DELTASONE) 10 MG tablet 4 tabs for 2 days, then 3 tabs for 2 days, 2 tabs for 2 days, then 1 tab for 2 days, then stop  . Tiotropium Bromide Monohydrate (SPIRIVA RESPIMAT) 2.5 MCG/ACT AERS Inhale 2 puffs into the lungs daily.   No facility-administered encounter medications on file as of 12/04/2018.      Review of Systems  Review of Systems   Constitutional: Positive for fatigue. Negative for activity change, chills, fever and unexpected weight change.  HENT: Negative for rhinorrhea, sinus pressure, sinus pain and sore throat.   Eyes: Negative.   Respiratory: Positive for cough (prod cough with yellow mucous ) and shortness of breath. Negative for wheezing.   Cardiovascular: Negative for chest pain and palpitations.  Gastrointestinal: Negative for constipation, diarrhea, nausea and vomiting.  Endocrine: Negative.   Genitourinary: Negative.   Musculoskeletal: Negative.   Skin: Negative.   Neurological: Negative for dizziness and headaches.  Psychiatric/Behavioral: Negative.  Negative for dysphoric mood. The patient is not nervous/anxious.  All other systems reviewed and are negative.    Physical Exam  BP 126/72 (BP Location: Left Arm, Cuff Size: Normal)   Pulse 87   Temp 98.6 F (37 C) (Oral)   Ht 6' (1.829 m)   Wt 164 lb (74.4 kg)   SpO2 99%   BMI 22.24 kg/m   Wt Readings from Last 5 Encounters:  12/04/18 164 lb (74.4 kg)  05/05/18 167 lb 11.2 oz (76.1 kg)  03/30/18 160 lb 9.6 oz (72.8 kg)  03/26/18 160 lb (72.6 kg)  03/26/18 161 lb (73 kg)     Physical Exam Vitals signs and nursing note reviewed.  Constitutional:      General: He is not in acute distress.    Appearance: Normal appearance. He is obese.  HENT:     Head: Normocephalic and atraumatic.     Right Ear: Hearing, tympanic membrane, ear canal and external ear normal.     Left Ear: Hearing, tympanic membrane, ear canal and external ear normal.     Nose: Nose normal.     Mouth/Throat:     Mouth: Mucous membranes are dry.     Pharynx: Oropharynx is clear. No oropharyngeal exudate.  Eyes:     Pupils: Pupils are equal, round, and reactive to light.  Neck:     Musculoskeletal: Normal range of motion.  Cardiovascular:     Rate and Rhythm: Normal rate and regular rhythm.     Pulses: Normal pulses.     Heart sounds: Normal heart sounds. No murmur.   Pulmonary:     Effort: Pulmonary effort is normal.     Breath sounds: Wheezing present. No decreased breath sounds or rales.  Musculoskeletal:     Right lower leg: No edema.     Left lower leg: No edema.  Lymphadenopathy:     Cervical: No cervical adenopathy.  Skin:    General: Skin is warm and dry.     Capillary Refill: Capillary refill takes less than 2 seconds.     Findings: No erythema or rash.  Neurological:     General: No focal deficit present.     Mental Status: He is alert and oriented to person, place, and time.     Motor: No weakness.     Coordination: Coordination normal.     Gait: Gait is intact. Gait normal.  Psychiatric:        Mood and Affect: Mood normal.        Behavior: Behavior normal. Behavior is cooperative.        Thought Content: Thought content normal.        Judgment: Judgment normal.      Lab Results:  CBC    Component Value Date/Time   WBC 9.6 03/24/2018 1246   WBC 14.3 (H) 04/03/2017 1227   RBC 4.88 03/24/2018 1246   RBC 4.55 04/03/2017 1227   HGB 15.2 (A) 03/24/2018 1246   HGB 14.4 10/24/2017 1550   HCT 44.9 (A) 03/24/2018 1246   HCT 42.5 10/24/2017 1550   PLT 269 10/24/2017 1550   MCV 92.1 03/24/2018 1246   MCV 96 10/24/2017 1550   MCH 31.1 03/24/2018 1246   MCH 31.4 12/14/2010 0610   MCHC 33.8 03/24/2018 1246   MCHC 33.3 04/03/2017 1227   RDW 13.7 10/24/2017 1550   LYMPHSABS 1.9 10/24/2017 1550   MONOABS 0.9 04/03/2017 1227   EOSABS 0.2 10/24/2017 1550   BASOSABS 0.0 10/24/2017 1550    BMET    Component Value Date/Time  NA 142 03/26/2018 1201   K 4.2 03/26/2018 1201   CL 104 03/26/2018 1201   CO2 21 03/26/2018 1201   GLUCOSE 117 (H) 03/26/2018 1201   GLUCOSE 100 (H) 04/03/2017 1227   BUN 19 03/26/2018 1201   CREATININE 0.80 03/26/2018 1201   CREATININE 0.90 04/18/2014 1535   CALCIUM 9.3 03/26/2018 1201   GFRNONAA 96 03/26/2018 1201   GFRAA 111 03/26/2018 1201    BNP No results found for: BNP  ProBNP     Component Value Date/Time   PROBNP 46.0 04/03/2017 1227      Assessment & Plan:   COPD   GOLD II/ still smoking Plan: Doxycycline today Prednisone taper today Continue Dulera 200 Continue Spiriva Respimat 2.5 [facility] with medication assistance Emphasized the need to stop smoking Referral to lung cancer screening   COPD exacerbation (HCC) Plan: Doxycycline today Prednisone taper today Continue inhalers as outlined Emphasized again the patient needs to stop smoking  Cigarette smoker Plan: Referral to lung cancer screening clinic Emphasized the need for the patient to stop smoking Patient is thinking about using nicotine replacement therapies, patient will contact our office if he is interested in starting Could consider referral to pulmonary pharmacy team for smoking sensation future follow-up visit    Return in about 2 months (around 02/04/2019), or if symptoms worsen or fail to improve, for Follow up with Dr. Sherene SiresWert, Follow up with Elisha HeadlandBrian Wauneta Silveria FNP-C.   Coral CeoBrian P Cortney Mckinney, NP 12/04/2018   This appointment was 28 minutes long with over 50% of the time in direct face-to-face patient care, assessment, plan of care, and follow-up.

## 2018-12-04 ENCOUNTER — Ambulatory Visit: Payer: BLUE CROSS/BLUE SHIELD | Admitting: Internal Medicine

## 2018-12-04 ENCOUNTER — Encounter: Payer: Self-pay | Admitting: Pulmonary Disease

## 2018-12-04 ENCOUNTER — Other Ambulatory Visit: Payer: Self-pay

## 2018-12-04 ENCOUNTER — Ambulatory Visit: Payer: BC Managed Care – PPO | Admitting: Pulmonary Disease

## 2018-12-04 VITALS — BP 126/72 | HR 87 | Temp 98.6°F | Ht 72.0 in | Wt 164.0 lb

## 2018-12-04 DIAGNOSIS — F17210 Nicotine dependence, cigarettes, uncomplicated: Secondary | ICD-10-CM | POA: Diagnosis not present

## 2018-12-04 DIAGNOSIS — J449 Chronic obstructive pulmonary disease, unspecified: Secondary | ICD-10-CM | POA: Diagnosis not present

## 2018-12-04 DIAGNOSIS — J441 Chronic obstructive pulmonary disease with (acute) exacerbation: Secondary | ICD-10-CM | POA: Insufficient documentation

## 2018-12-04 MED ORDER — SPIRIVA RESPIMAT 2.5 MCG/ACT IN AERS: 2.0000 | INHALATION_SPRAY | Freq: Every day | RESPIRATORY_TRACT | 0 refills | Status: DC

## 2018-12-04 MED ORDER — DOXYCYCLINE HYCLATE 100 MG PO TABS: 100.0000 mg | ORAL_TABLET | Freq: Two times a day (BID) | ORAL | 0 refills | Status: DC

## 2018-12-04 MED ORDER — PREDNISONE 10 MG PO TABS: ORAL_TABLET | ORAL | 0 refills | Status: DC

## 2018-12-04 NOTE — Assessment & Plan Note (Signed)
Plan: Doxycycline today Prednisone taper today Continue Dulera 200 Continue Spiriva Respimat 2.5 [facility] for help with medication assistance Emphasized the need to stop smoking Referral to lung cancer screening

## 2018-12-04 NOTE — Assessment & Plan Note (Signed)
Plan: Doxycycline today Prednisone taper today Continue inhalers as outlined Emphasized again the patient needs to stop smoking

## 2018-12-04 NOTE — Assessment & Plan Note (Signed)
Plan: Referral to lung cancer screening clinic Emphasized the need for the patient to stop smoking Patient is thinking about using nicotine replacement therapies, patient will contact our office if he is interested in starting Could consider referral to pulmonary pharmacy team for smoking sensation future follow-up visit

## 2018-12-04 NOTE — Patient Instructions (Addendum)
Doxycycline >>> 1 100 mg tablet every 12 hours for 7 days >>>take with food  >>>wear sunscreen   Prednisone 10mg  tablet  >>>4 tabs for 2 days, then 3 tabs for 2 days, 2 tabs for 2 days, then 1 tab for 2 days, then stop >>>take with food  >>>take in the morning   Dulera 200 >>> 2 puffs in the morning right when you wake up, rinse out your mouth after use, 12 hours later 2 puffs, rinse after use >>> Take this daily, no matter what >>> This is not a rescue inhaler   Spiriva Respimat 2.5 >>> 2 puffs daily >>> Do this every day >>>This is not a rescue inhaler  Only use your albuterol as a rescue medication to be used if you can't catch your breath by resting or doing a relaxed purse lip breathing pattern.  - The less you use it, the better it will work when you need it. - Ok to use up to 2 puffs  every 4 hours if you must but call for immediate appointment if use goes up over your usual need - Don't leave home without it !!  (think of it like the spare tire for your car)   [facility] for help with cost of inhalers  We recommend that you stop smoking.  >>>You need to set a quit date >>>If you have friends or family who smoke, let them know you are trying to quit and not to smoke around you or in your living environment  Smoking Cessation Resources:  1 800 QUIT NOW  >>> Patient to call this resource and utilize it to help support her quit smoking >>> Keep up your hard work with stopping smoking  You can also contact the [facility] >>>For smoking cessation classes call [phone]  We do not recommend using e-cigarettes as a form of stopping smoking  You can sign up for smoking cessation support texts and information:  >>>https://smokefree.gov/smokefreetxt    We will refer you today to her lung cancer screening program >>>This is based off of your 47 pack-year smoking history >>> This is a recommendation from the [place] preventative  services task force (USPSTF) >>>The USPSTF recommends annual screening for lung cancer with low-dose computed tomography (LDCT) in adults aged 63 to 80 years who have a 30 pack-year smoking history and currently smoke or have quit within the past 15 years. Screening should be discontinued once a person has not smoked for 15 years or develops a health problem that substantially limits life expectancy or the ability or willingness to have curative lung surgery.   Our office will call you and set up an appointment with Kandice RobinsonsSarah Groce (Nurse Practitioner) who leads this program.  This appointment takes place in our office.  After completing this meeting with Kandice RobinsonsSarah Groce NP you will get a low-dose CT as the screening >>>We will call you with those results    Return in about 2 months (around 02/04/2019), or if symptoms worsen or fail to improve, for Follow up with Dr. Sherene SiresWert, Follow up with Elisha HeadlandBrian Mack FNP-C.   Coronavirus (COVID-19) Are you at risk?  Are you at risk for the Coronavirus (COVID-19)?  To be considered HIGH RISK for Coronavirus (COVID-19), you have to meet the following criteria:  . Traveled to [place], [place], [place], [place] or [place]; or in the [place] to [place], [place], [place], or [place]; and have fever, cough, and shortness of  breath within the last 2 weeks of travel OR . Been in close contact with a person diagnosed with COVID-19 within the last 2 weeks and have fever, cough, and shortness of breath . IF YOU DO NOT MEET THESE CRITERIA, YOU ARE CONSIDERED LOW RISK FOR COVID-19.  What to do if you are HIGH RISK for COVID-19?  Marland Kitchen If you are having a medical emergency, call 911. . Seek medical care right away. Before you go to a doctor's office, urgent care or emergency department, call ahead and tell them about your recent travel, contact with someone diagnosed with COVID-19, and your symptoms. You should receive instructions from your physician's office regarding next  steps of care.  . When you arrive at healthcare provider, tell the healthcare staff immediately you have returned from visiting [place], [place], [place], [place] or [place]; or traveled in the [place] to [place], [place], [place], or [place]; in the last two weeks or you have been in close contact with a person diagnosed with COVID-19 in the last 2 weeks.   . Tell the health care staff about your symptoms: fever, cough and shortness of breath. . After you have been seen by a medical provider, you will be either: o Tested for (COVID-19) and discharged home on quarantine except to seek medical care if symptoms worsen, and asked to  - Stay home and avoid contact with others until you get your results (4-5 days)  - Avoid travel on public transportation if possible (such as bus, train, or airplane) or o Sent to the Emergency Department by EMS for evaluation, COVID-19 testing, and possible admission depending on your condition and test results.  What to do if you are LOW RISK for COVID-19?  Reduce your risk of any infection by using the same precautions used for avoiding the common cold or flu:  Marland Kitchen Wash your hands often with soap and warm water for at least 20 seconds.  If soap and water are not readily available, use an alcohol-based hand sanitizer with at least 60% alcohol.  . If coughing or sneezing, cover your mouth and nose by coughing or sneezing into the elbow areas of your shirt or coat, into a tissue or into your sleeve (not your hands). . Avoid shaking hands with others and consider head nods or verbal greetings only. . Avoid touching your eyes, nose, or mouth with unwashed hands.  . Avoid close contact with people who are sick. . Avoid places or events with large numbers of people in one location, like concerts or sporting events. . Carefully consider travel plans you have or are making. . If you are planning any travel outside or inside the [place], visit the CDC's Travelers' Health  webpage for the latest health notices. . If you have some symptoms but not all symptoms, continue to monitor at home and seek medical attention if your symptoms worsen. . If you are having a medical emergency, call 911.   [place] / e-Visit: [email or web address]         [facility]: [facility]: [phone]                   [facility]: [phone]           It is flu season:   >>> Best ways to protect herself from the flu: Receive the yearly flu vaccine, practice good hand hygiene washing with  soap and also using hand sanitizer when available, eat a nutritious meals, get adequate rest, hydrate appropriately   Please contact the office if your symptoms worsen or you have concerns that you are not improving.   Thank you for choosing [facility] for your healthcare, and for allowing us to partner with you on your healthcare journey. I am thankful to be able to provide care to you today.   Elisha HeadlandBrian Mack FNP-C

## 2018-12-11 ENCOUNTER — Encounter: Payer: Self-pay | Admitting: *Deleted

## 2018-12-11 ENCOUNTER — Telehealth: Payer: Self-pay | Admitting: Pharmacist

## 2018-12-11 ENCOUNTER — Other Ambulatory Visit: Payer: Self-pay | Admitting: *Deleted

## 2018-12-11 NOTE — Patient Outreach (Signed)
[name]) [place]   12/11/2018  Anthony Mayo [birth year] [id number]  Reason for referral: medication assistance  Referral source:Telephonic Nurse Referral medication(s): Ventolin HFA, Ruthe Mannan, Spiriva  Current insurance: BCBS  HPI:  Patient is a 63 year old male with multiple medical conditions including but not limited to:  Asthma, Chronic hepatitis C, COPD, and depression. Patient reported his inhalers cost him >$100 but he was not exactly sure of the amount.  Objective: No Known Allergies  Medications Reviewed Today    Reviewed by Elayne Guerin, [facility] (Pharmacist) on 12/11/18 at 33  Med List Status: <None>  Medication Order Taking? Sig Documenting Provider Last Dose Status Informant  albuterol (VENTOLIN HFA) 108 (90 Base) MCG/ACT inhaler [id number] Yes TAKE 2 PUFFS BY MOUTH EVERY 6 HOURS AS NEEDED Tanda Rockers, MD Taking Active   allopurinol (ZYLOPRIM) 100 MG tablet [id number] Yes Take 1 tablet (100 mg total) by mouth daily. Horald Pollen, MD Taking Active   Colchicine [facility]) 0.6 MG CAPS [id number] Yes Take 0.6 mg by mouth daily. Horald Pollen, MD Taking Active   indomethacin (INDOCIN) 50 MG capsule [id number] Yes TAKE 50 MG 3 TIMES DAILY ONLY WHEN NEEDED FOR FLARES OF GOUT Horald Pollen, MD Taking Active Self  ipratropium (ATROVENT) 0.02 % nebulizer solution [id number] Yes Take 2.5 mLs (0.5 mg total) by nebulization 4 (four) times daily. Leonie Douglas, PA-C Taking Active Self           Med Note Elayne Guerin   Fri Dec 11, 2018  3:39 PM)         Patient not taking:      Discontinued 12/11/18 1552 (Completed Course)   Melatonin Gummies 2.5 MG CHEW [id number] Yes Chew 5 mg by mouth at bedtime. [provider] Taking Active Self  mometasone-formoterol (DULERA) 200-5 MCG/ACT Hollie Salk [id number] Yes Take 2 puffs first thing in am and then another 2 puffs about 12 hours later. Tanda Rockers, MD Taking  Active   ondansetron (ZOFRAN ODT) 4 MG disintegrating tablet [id number] Yes Take 1 tablet (4 mg total) by mouth every 8 (eight) hours as needed for nausea or vomiting. Kuppelweiser, Cassie L, RPH-CPP Taking Active Self       Patient not taking:      Discontinued 12/11/18 1553 (Completed Course)   Tiotropium Bromide Monohydrate (SPIRIVA RESPIMAT) 2.5 MCG/ACT AERS [id number] Yes Inhale 2 puffs into the lungs daily. Lauraine Rinne, NP Taking Active   Med List Note Serafina Royals, [place] 03/30/18 1040):              Assessment:  Drugs sorted by system:  Pulmonary/Allergy: Ventolin HFA, Atrovent HFA, Dulera, Spiriva  Gastrointestinal: Ondansetron  Renal: Allopurinol, Colchicine,   Pain: Indomethacin  Vitamins/Minerals/Supplements: Melatonin  Medication Assistance Findings:  Medication assistance needs identified: copay assistance with Ventolin HFA, Dulera and Spiriva  BCBS was called. Patient has [name]. As such his copays are:  Tier 1- $15 Tier 3-$30 Tier 4-$50  Patient's inhalers are tier 3 medications. Because he has Pharmacist, community, he is not eligible for patient assistance programming.  A free trial and discount coupons were found for Spiriva and Dulera.    Both inhalers are too early to be filled.  Patient will be mailed the coupons.  While speaking with [employer], it was determined the patient will have to use mail order after 2 complimentary fills at a retail pharmacy.  According to the [facility], [facility] should  be able to walk the patient through getting linked with mail order.  Patient said he would call.     Additional medication assistance options reviewed with patient as warranted:  Coupon and Discount pharmacy lists   Patient will be sent coupons for Spiriva and Dulera via mail.  Plan: Follow up with the patient in 2-3 weeks to be sure he received the coupons.   [name], PharmD, BCACP [facility] Clinical  Pharmacist [phone]

## 2018-12-11 NOTE — Patient Outreach (Signed)
[place] [facility]) Care Management  12/11/2018  Anthony Mayo [birth year] [id number]   Subjective: Telephone call to patient's home / mobile number, spoke with patient, and HIPAA verified.  Discussed [place] MD referral follow up, patient voiced understanding, and is in agreement to follow up.  Patient states he is doing pretty good, provider advised he would be receiving a call from [place] Management, had a follow up visit with pulmonologist provider on 12/04/2018, and visit went well.  Discussed [facility] Care Management serivces, patient declined referral to [place] for COPD disease management, and declined referral to [place] [facility] Social Worker regarding smoking cessation community resources. States he is aware that he needs to stop smoking, is going to try to do it on his own without medications at this time, is aware of community resources available through [place] Social Worker, and through providers office, will access as needed.  Patient states he needs assistance with the [facility] and Spiriva inhalers, is in agreement to a referral to [place] for [facility] and Spiriva medication assistance.   Patient states he is able to manage self care and has assistance as needed.   Patient voices understanding of medical diagnosis and treatment plan.  States he is accessing his BCBS benefits as needed via member services number on back of card.  Patient states he does not have any education material, transition of care, care coordination, disease management, disease monitoring, transportation, or community resource needs at this time.  States he is very appreciative of the follow up and is in agreement to receive [place] [facility] services.      Objective: Per KPN (Knowledge Performance Now, point of care tool) and chart review, patient has had no recent ED visits or hospitalizations.  Patient also has a history of  COPD, Asthma, and gout.    Assessment: Received BCBS [place] MD Referral on 12/04/2018.   Referral source: Wyn Quaker, Nurse Practitioner.  Referral reason: Medication management for inhaler cost, diagnosis COPD/ Pneumonia.    Screening follow up completed and will refer patient to [place] for  for [facility] and Spiriva medication assistance.      Plan: RNCM will refer patient to [place] for  for [facility] and Spiriva medication assistance.     Colletta Spillers H. Annia Friendly, BSN, [place] Management [facility] Telephonic CM Phone: [phone] Fax: [phone]

## 2018-12-21 NOTE — Progress Notes (Signed)
Chart and office note reviewed in detail  > agree with a/p as outlined    

## 2018-12-31 ENCOUNTER — Ambulatory Visit: Payer: Self-pay | Admitting: Pharmacist

## 2018-12-31 ENCOUNTER — Other Ambulatory Visit: Payer: Self-pay | Admitting: Pharmacist

## 2018-12-31 NOTE — Patient Outreach (Signed)
[place] [facility]) Care Management  12/31/2018  Segundo Makela 1955-05-29 [id number]   Patient was called regarding medication assistance. He was sent coupons to help with the copays of Dulera and Spiriva.   Unfortunately, patient did not answer his phone. HIPAA compliant message was left on his voicemail.  Plan: Send unsuccessful outreach letter. Call patient back in 10-14 business days.   Elayne Guerin, PharmD, [place] Clinical Pharmacist [phone]

## 2019-01-13 ENCOUNTER — Other Ambulatory Visit: Payer: Self-pay | Admitting: Pharmacist

## 2019-01-13 ENCOUNTER — Ambulatory Visit: Payer: Self-pay | Admitting: Pharmacist

## 2019-01-13 NOTE — Patient Outreach (Signed)
[facility]) Care Management  01/13/2019  Anthony Mayo 04/20/1956 [id number]  Patient was called regarding medication assistance. Unfortunately, he did not answer his phone. HIPAA compliant message was left on his voicemail.  Today's call was the second unsuccessful phone call.  Unsuccessful outreach letter was sent on 12/31/18.  Plan: Call patient back in 10-14 business days. Close case if unable to reach the patient.  Elayne Guerin, PharmD, [place] Clinical Pharmacist [phone]   .

## 2019-02-04 ENCOUNTER — Ambulatory Visit: Payer: BC Managed Care – PPO | Admitting: Internal Medicine

## 2019-02-04 ENCOUNTER — Other Ambulatory Visit: Payer: Self-pay

## 2019-02-04 ENCOUNTER — Ambulatory Visit (INDEPENDENT_AMBULATORY_CARE_PROVIDER_SITE_OTHER): Payer: BC Managed Care – PPO

## 2019-02-04 ENCOUNTER — Encounter: Payer: Self-pay | Admitting: Internal Medicine

## 2019-02-04 DIAGNOSIS — Z23 Encounter for immunization: Secondary | ICD-10-CM | POA: Diagnosis not present

## 2019-02-04 DIAGNOSIS — J449 Chronic obstructive pulmonary disease, unspecified: Secondary | ICD-10-CM

## 2019-02-04 DIAGNOSIS — F17210 Nicotine dependence, cigarettes, uncomplicated: Secondary | ICD-10-CM

## 2019-02-04 DIAGNOSIS — R0602 Shortness of breath: Secondary | ICD-10-CM | POA: Diagnosis not present

## 2019-02-04 MED ORDER — AZITHROMYCIN 250 MG PO TABS: ORAL_TABLET | ORAL | 0 refills | Status: DC

## 2019-02-04 MED ORDER — ALBUTEROL SULFATE (2.5 MG/3ML) 0.083% IN NEBU: 2.5000 mg | INHALATION_SOLUTION | RESPIRATORY_TRACT | 12 refills | Status: DC | PRN

## 2019-02-04 MED ORDER — PREDNISONE 10 MG PO TABS: ORAL_TABLET | ORAL | 0 refills | Status: DC

## 2019-02-04 MED ORDER — MOMETASONE FURO-FORMOTEROL FUM 200-5 MCG/ACT IN AERO: 2.0000 | INHALATION_SPRAY | Freq: Two times a day (BID) | RESPIRATORY_TRACT | 0 refills | Status: DC

## 2019-02-04 NOTE — Patient Instructions (Addendum)
Plan A = Automatic = Always=    dulera 200 2 puffs in / spiriva 2 puffs then dulera 12 hours later   Plan B = Backup (to supplement plan A, not to replace it) Only use your albuterol inhaler as a rescue medication to be used if you can't catch your breath by resting or doing a relaxed purse lip breathing pattern.  - The less you use it, the better it will work when you need it. - Ok to use the inhaler up to 2 puffs  every 4 hours if you must but call for appointment if use goes up over your usual need - Don't leave home without it !!  (think of it like the spare tire for your car)   Plan C = Crisis (instead of Plan B but only if Plan B stops working) - only use your albuterol nebulizer if you first try Plan B and it fails to help > ok to use the nebulizer up to every 4 hours but if start needing it regularly call for immediate appointment   Plan D = Deltasone  Prednisone 10 mg take 2 each until better then 1 daily x 5 days and stop   Plan E = ER - go to ER or call 911 if all else fails    zpak    The key is to stop smoking completely before smoking completely stops you!    Please remember to go to the  x-ray department  for your tests - we will call you with the results when they are available     Please schedule a follow up visit in 3 months but call sooner if needed  with all medications /inhalers/ solutions in hand so we can verify exactly what you are taking. This includes all medications from all doctors and over the counters

## 2019-02-04 NOTE — Progress Notes (Signed)
Subjective:    Patient ID: Anthony Mayo, male   DOB: 10-Mar-1956      MRN: [id number]   Brief patient profile:  63yowm active smoker works in [employer]  with dx of asthma/ bronchitis as child and very limited physically but could do swim team - never could do sports- seemed better around age [age]  And by 20's doing better then around late 63's doeand dx as copd 2015 at [facility] and referred to pulmonary clinic 04/03/2017 by Dr   Mitchel Honour  with GOLD II spirometry at initial eval.    History of Present Illness  04/03/2017 1st [facility] office visit/ Wert   Chief Complaint  Patient presents with  . Pulmonary Consult    Referred by Dr. Nonie Hoyer. Pt states dxed with COPD approx 3 yrs ago. He c/o increased DOE for the past 3 months. He gets SOB when he lifts things and when he gets in a hurry. He states he has also had trouble keeping up with yard work. He is also coughing more- prod with yellow to clear sputum.  Cough states cough sometimes wakes him up. He coughs until he is dizzy and feels faint.   was doing some better on  dulera /saba worse started back up one day prior to OV  And  Some better Am of ov dulera 100 x 2 puffs, no saba  Bad am cough x sev tbsp clear mucus am of ov  Doe= MMRC2 = can't walk a nl pace on a flat grade s sob but does fine slow and flat eg walking at [employer] ok  Sleeping poorly due to restless, not cough or sob  Prednisone really helps transiently  rec Plan A = Automatic =  dulera 100 Take 2 puffs first thing in am chase with the spiriva x 2 pffs  and then another 2 puffs of just the dulera about 12 hours later  Plan B = Backup Only use your albuterol as a rescue medication  The key is to stop smoking completely before smoking completely stops you! For cough > mucinex dm up to 1200 mg every 12 hours as needed   Please schedule a follow up office visit in 4 weeks, sooner if needed  with all medications /inhalers/ solutions in hand so we can verify exactly  what you are taking. This includes all medications from all doctors and over the counters    03/30/2018  Acute extended  ov/Wert re: aecopd  X 3 months/maint on dulera 100/spiriva smi/ last worked Mar 23 2018  Chief Complaint  Patient presents with  . Acute Visit    Pt c/o increased SOB for the past 3 months. He is coughing up dark yellow to green sputum.  He also c/o wheezing and chest tightness.  He is using his ventolin about 4 x per day. He was recently prescribed nebs but not started on yet.   Dyspnea:  MMRC3 = can't walk 100 yards even at a slow pace at a flat grade s stopping due to sob   Cough:  Esp in am prod sev tbsp yellow mucus assoc with nasal congestion  Sleeping: on side . Bed flat / 2 pillows  SABA use: 4 puffs today last around 5 h prior to OV  / still very confused with how to use meds  rec Plan A = Automatic = Dulera 100 (200 when this one is empty)  Take 2 puffs first thing in am chase with the spiriva x 2 pffs  and then another 2 puffs of just the dulera about 12 hours later  Prednisone 10 mg Take 4 for three days 3 for three days 2 for three days 1 for three days and stop  Augmentin 875 mg take one pill twice daily  X 10 days - take at breakfast and supper with large glass of water.  It would help reduce the usual side effects (diarrhea and yeast infections) if you ate cultured yogurt at lunch.  Plan B = Backup Only use your albuterol inhaler as a rescue medication  Plan C = Crisis - only use your albuterol nebulizer if you first try Plan B and it fails to help > ok to use the nebulizer up to every 4 hours but if start needing it regularly call for immediate appointment For cough mucinex dm up to 1200 mg every 12 hours as needed  The key is to stop smoking completely before smoking completely stops you!  Out of work until 04/06/18           02/04/2019  f/u ov/Wert re:  Copd gold II/ still smoking/  dulera 200/ spiriva  Chief Complaint  Patient presents with  .  Follow-up    Breathing is slightly worse since the last visit. Still smoking about 1/2 ppd. He is using his albuterol inhaler 3 x per day on average.    Dyspnea: still = MMRC2 = can't walk a nl pace on a flat grade s sob but does fine slow and flat   Cough: darker, esp in am x sev weeks Sleeping: lie flat 2pillows  SABA use: as above, also neb some 02: none    No obvious day to day or daytime variability or assoc excess/ purulent sputum or mucus plugs or hemoptysis or cp or chest tightness, subjective wheeze or overt sinus or hb symptoms.   Sleeping as above without nocturnal   exacerbation  of respiratory  c/o's or need for noct saba. Also denies any obvious fluctuation of symptoms with weather or environmental changes or other aggravating or alleviating factors except as outlined above   No unusual exposure hx or h/o childhood pna/ asthma or knowledge of premature birth.  Current Allergies, Complete Past Medical History, Past Surgical History, Family History, and Social History were reviewed in [facility] electronic medical record.  ROS  The following are not active complaints unless bolded Hoarseness, sore throat, dysphagia, dental problems, itching, sneezing,  nasal congestion or discharge of excess mucus or purulent secretions, ear ache,   fever, chills, sweats, unintended wt loss or wt gain, classically pleuritic or exertional cp,  orthopnea pnd or arm/hand swelling  or leg swelling, presyncope, palpitations, abdominal pain, anorexia, nausea, vomiting, diarrhea  or change in bowel habits or change in bladder habits, change in stools or change in urine, dysuria, hematuria,  rash, arthralgias, visual complaints, headache, numbness, weakness or ataxia or problems with walking or coordination,  change in mood or  memory.        Current Meds  Medication Sig  . albuterol (VENTOLIN HFA) 108 (90 Base) MCG/ACT inhaler TAKE 2 PUFFS BY MOUTH EVERY 6 HOURS AS NEEDED  . allopurinol (ZYLOPRIM)  100 MG tablet Take 1 tablet (100 mg total) by mouth daily.  Marland Kitchen. ipratropium (ATROVENT) 0.02 % nebulizer solution Take 2.5 mLs (0.5 mg total) by nebulization 4 (four) times daily.  . Melatonin Gummies 2.5 MG CHEW Chew 5 mg by mouth at bedtime.  . mometasone-formoterol (DULERA) 200-5 MCG/ACT AERO Take 2 puffs first  thing in am and then another 2 puffs about 12 hours later.  . ondansetron (ZOFRAN ODT) 4 MG disintegrating tablet Take 1 tablet (4 mg total) by mouth every 8 (eight) hours as needed for nausea or vomiting.  . Tiotropium Bromide Monohydrate (SPIRIVA RESPIMAT) 2.5 MCG/ACT AERS Inhale 2 puffs into the lungs daily.                 Objective:   Physical Exam   amb wm slt hoarseness   02/04/2019       169  05/05/2018     167 03/30/2018     160   04/03/17 167 lb 6.4 oz (75.9 kg)  04/01/17 161 lb (73 kg)  04/18/14 179 lb 12.8 oz (81.6 kg)      Vital signs reviewed - Note on arrival 02 sats  97% on RA      HEENT : pt wearing mask not removed for exam due to covid - 19 concerns.   NECK :  without JVD/Nodes/TM/ nl carotid upstrokes bilaterally   LUNGS: no acc muscle use,  Mod barrel  contour chest wall with bilateral  Exp rhonchi  and  without cough on insp or exp maneuvers and mod  Hyperresonant  to  percussion bilaterally     CV:  RRR  no s3 or murmur or increase in P2, and no edema   ABD:  soft and nontender with pos mid insp Hoover's  in the supine position. No bruits or organomegaly appreciated, bowel sounds nl  MS:     ext warm without deformities, calf tenderness, cyanosis or clubbing No obvious joint restrictions   SKIN: warm and dry without lesions    NEURO:  alert, approp, nl sensorium with  no motor or cerebellar deficits apparent.         CXR PA and Lateral:   02/04/2019 :    I personally reviewed images and agree with radiology impression as follows:    No active cardiopulmonary disease.        Assessment:

## 2019-02-05 ENCOUNTER — Encounter: Payer: Self-pay | Admitting: Internal Medicine

## 2019-02-05 NOTE — Assessment & Plan Note (Signed)
Active smoker Spirometry 04/03/2017  FEV1 2.05 (54%)  Ratio 52 with atypical curvature p am dulera 100 x2 - 04/03/2017    add spiriva   - Alpha one AT  Screening 04/03/17 :   SS   Level 175    - PFT's  05/05/2018  FEV1 2.21 (58 % ) ratio 51   p 12  % improvement from saba p dulera 200/spiriva prior to study with DLCO  67  % corrects to 68 % for alv volume   02/04/2019  After extensive coaching inhaler device,  effectiveness =   90%    Group D in terms of symptom/risk and laba/lama/ICS  therefore appropriate rx at this point >>>  Continue dulera 200/ spiriva   Add pred as Plan D = 20 mg daily until better then 10 mg daily x 5 days and off

## 2019-02-05 NOTE — Assessment & Plan Note (Signed)
Counseled re importance of smoking cessation but did not meet time criteria for separate billing     I had an extended discussion with the patient reviewing all relevant studies completed to date and  lasting 15 to 20 minutes of a 25 minute visit    I performed detailed device teaching using a teach back method which extended face to face time for this visit (see above)  Each maintenance medication was reviewed in detail including emphasizing most importantly the difference between maintenance and prns and under what circumstances the prns are to be triggered using an action plan format that is not reflected in the computer generated alphabetically organized AVS which I have not found useful in most complex patients, especially with respiratory illnesses  Please see AVS for specific instructions unique to this visit that I personally wrote and verbalized to the the pt in detail and then reviewed with pt  by my nurse highlighting any  changes in therapy recommended at today's visit to their plan of care.     

## 2019-02-08 NOTE — Progress Notes (Signed)
LMTCB

## 2019-02-11 ENCOUNTER — Ambulatory Visit: Payer: Self-pay | Admitting: Pharmacist

## 2019-02-11 ENCOUNTER — Other Ambulatory Visit: Payer: Self-pay | Admitting: Pharmacist

## 2019-02-11 NOTE — Patient Outreach (Signed)
[facility] [facility]) Care Management  02/11/2019  Anthony Mayo May 26, 1955 [id number]  Patient was called regarding medication assistance. Unfortunately, he did not answer his phone. HIPAA compliant message was left on his voicemail.  Patient has been called multiple times without an answer. Unsuccessful contact letter was sent as well.  Plan: Close case due to inability to reach the patient. Send closure letters to patient and PCP.  Elayne Guerin, PharmD, [place] Clinical Pharmacist [phone]

## 2019-02-23 ENCOUNTER — Telehealth: Payer: Self-pay | Admitting: Pulmonary Disease

## 2019-02-23 NOTE — Telephone Encounter (Signed)
Noted.   Smt. Loder FNP  

## 2019-02-23 NOTE — Telephone Encounter (Signed)
FYI: Due to multiple unsuccessful attempts to contact pt to schedule lung cancer screening, this referral has been cancelled. .  

## 2019-06-30 ENCOUNTER — Other Ambulatory Visit: Payer: Self-pay | Admitting: Internal Medicine

## 2019-06-30 MED ORDER — PREDNISONE 10 MG PO TABS: ORAL_TABLET | ORAL | 1 refills | Status: DC

## 2019-07-30 ENCOUNTER — Other Ambulatory Visit: Payer: Self-pay | Admitting: Internal Medicine

## 2019-07-30 MED ORDER — DULERA 200-5 MCG/ACT IN AERO: INHALATION_SPRAY | RESPIRATORY_TRACT | 0 refills | Status: DC

## 2019-08-02 ENCOUNTER — Other Ambulatory Visit: Payer: Self-pay

## 2019-08-02 ENCOUNTER — Ambulatory Visit (INDEPENDENT_AMBULATORY_CARE_PROVIDER_SITE_OTHER): Payer: BC Managed Care – PPO | Admitting: Pulmonary Disease

## 2019-08-02 ENCOUNTER — Encounter: Payer: Self-pay | Admitting: Pulmonary Disease

## 2019-08-02 ENCOUNTER — Telehealth: Payer: Self-pay | Admitting: Pulmonary Disease

## 2019-08-02 DIAGNOSIS — F17210 Nicotine dependence, cigarettes, uncomplicated: Secondary | ICD-10-CM

## 2019-08-02 DIAGNOSIS — J449 Chronic obstructive pulmonary disease, unspecified: Secondary | ICD-10-CM

## 2019-08-02 DIAGNOSIS — J441 Chronic obstructive pulmonary disease with (acute) exacerbation: Secondary | ICD-10-CM | POA: Diagnosis not present

## 2019-08-02 DIAGNOSIS — Z79899 Other long term (current) drug therapy: Secondary | ICD-10-CM | POA: Insufficient documentation

## 2019-08-02 DIAGNOSIS — Z0000 Encounter for general adult medical examination without abnormal findings: Secondary | ICD-10-CM | POA: Insufficient documentation

## 2019-08-02 MED ORDER — PREDNISONE 10 MG PO TABS: ORAL_TABLET | ORAL | 0 refills | Status: DC

## 2019-08-02 MED ORDER — AZITHROMYCIN 250 MG PO TABS: ORAL_TABLET | ORAL | 0 refills | Status: DC

## 2019-08-02 MED ORDER — DULERA 200-5 MCG/ACT IN AERO: INHALATION_SPRAY | RESPIRATORY_TRACT | 6 refills | Status: DC

## 2019-08-02 NOTE — Telephone Encounter (Signed)
I called pt and he stated Cherina had called him. He had a televisit with Arlys John and I guessed Cherina called to make an appointment in 6 weeks with Dr. Sherene Sires. I went ahead and scheduled pt for 09/13/2019 at 9:30 with Dr. Sherene Sires. FYI Cherina

## 2019-08-02 NOTE — Assessment & Plan Note (Signed)
Plan: Referred back [facility] to help with medication cost

## 2019-08-02 NOTE — Assessment & Plan Note (Signed)
Plan: Referred back to the lung cancer screening program today Emphasized need to stop smoking Congratulated patient on progress of getting down to 0.5 packs/day We will schedule clinical pharmacy team appointment at next follow-up

## 2019-08-02 NOTE — Assessment & Plan Note (Signed)
Plan: Prednisone taper today Z-Pak today

## 2019-08-02 NOTE — Assessment & Plan Note (Signed)
Plan:  Continue Dulera 200 Continue Spiriva Respimat 2.5 Continue rescue inhaler Emphasized importance of stopping smoking Patient will need clinical pharmacy team follow-up to work on stopping smoking Referral to lung cancer screening program 6-week follow-up with our office Z-Pak and prednisone taper today

## 2019-08-02 NOTE — Assessment & Plan Note (Signed)
Plan:  Getting covid shot tomm

## 2019-08-02 NOTE — Progress Notes (Signed)
Virtual Visit via Telephone Note  I connected with Anthony Mayo on 08/02/19 at  9:30 AM EDT by telephone and verified that I am speaking with the correct person using two identifiers.  Location: Patient: Home Provider: Office [facility] - [address] [place], [address], [place], [place] [address]   I discussed the limitations, risks, security and privacy concerns of performing an evaluation and management service by telephone and the availability of in person appointments. I also discussed with the patient that there may be a patient responsible charge related to this service. The patient expressed understanding and agreed to proceed.  Patient consented to consult via telephone: Yes People present and their role in pt care: Pt     History of Present Illness:  64 year old male current every day smoker followed in our office for COPD  PMH: Headache, depression with anxiety, chronic hep C Smoker/ Smoking History: Current every day smoker. 1 ppd. 47 ppd.  Maintenance:  Dulera 200, Spiriva 2.5 Pt of: Dr. Sherene Sires  Chief complaint: Follow up / last ov [id number]  64 year old male current everyday smoker followed in our office for COPD.  Patient has made progress since last office visit in September/2020 with Dr. Sherene Sires with stopping smoking.  He is down to 0.5 packs/day.  Patient is requesting refills of his Dulera today.  He has remained adherent to both his Dulera and Spiriva Respimat.  At last office visit he was treated as a COPD exacerbation with a Z-Pak.  Patient feels that he is having similar symptoms today with increased nasal congestion.  His not having to use his rescue inhaler too often about 1 time a week.  He reports no recent antibiotic or prednisone use over the last 3 months.  Last year patient was referred [facility] but after multiple times to reach the patient they closed the referral.  Last year patient was also referred to lung cancer screening program in our  office after multiple attempts to reach the patient this referral was closed.  We will review this today.  Patient is scheduled to receive his Covid vaccine tomorrow.  Smoking assessment and cessation counseling  Patient currently smoking: 0.5 ppd  I have advised the patient to quit/stop smoking as soon as possible due to high risk for multiple medical problems.  It will also be very difficult for [place] to manage patient's  respiratory symptoms and status if we continue to expose her lungs to a known irritant.  We do not advise e-cigarettes as a form of stopping smoking.  Patient is willing to quit smoking. Hasnt set quit date yet.   I have advised the patient that we can assist and have options of nicotine replacement therapy, provided smoking cessation education today, provided smoking cessation counseling, and provided cessation resources.  Follow-up next office visit office visit for assessment of smoking cessation.  Smoking cessation counseling advised for: 5 min   Observations/Objective:  03/24/2018-chest x-ray-Stable emphysematous changes but no acute overlay or pulmonary process  05/05/2018-pulmonary function test- FVC 4 (79% predicted), postbronchodilator ratio 51, postbronchodilator FEV1 2.21 (58% predicted), bronchodilator response with FEV1, mid flow reversibility, DLCO 67  Social History   Tobacco Use  Smoking Status Current Every Day Smoker  . Packs/day: 1.00  . Years: 47.00  . Pack years: 47.00  . Types: Cigarettes  Smokeless Tobacco Current User  . Types: Snuff   Immunization History  Administered Date(s) Administered  . Influenza Split 03/24/2012  . Influenza,inj,Quad PF,6+ Mos 04/18/2014, 02/04/2019  .  Pneumococcal Polysaccharide-23 02/11/2018   Assessment and Plan:  Discussion: Reviewed that follow-up with our referrals as well as with our office is important.  Patient agrees.  After discussion today patient is willing to be referred to lung cancer  screening program as well as to [facility] to help with medication cost.  I explained to the patient that he will need to follow-up if he misses telephone calls.  We will be unable to continue to refer him if he continues to not follow-up.  Patient reports that he understands and he reports that he would look forward to the telephone call us to proceed forward.  He does have anxiety regarding the lung cancer screening program.  We discussed this.  All questions were answered.  He will proceed forward with the screening.  Healthcare maintenance Plan:  Getting covid shot tomm  COPD   GOLD II/ still smoking Plan:  Continue Dulera 200 Continue Spiriva Respimat 2.5 Continue rescue inhaler Emphasized importance of stopping smoking Patient will need clinical pharmacy team follow-up to work on stopping smoking Referral to lung cancer screening program 6-week follow-up with our office Z-Pak and prednisone taper today   Cigarette smoker Plan: Referred back to the lung cancer screening program today Emphasized need to stop smoking Congratulated patient on progress of getting down to 0.5 packs/day We will schedule clinical pharmacy team appointment at next follow-up  Medication management Plan: [facility]erred [facility] to help with medication cost  COPD exacerbation ([place]) Plan: Prednisone taper today Z-Pak today  Follow Up Instructions:  Return in about 6 weeks (around 09/13/2019), or if symptoms worsen or fail to improve, for Follow up with Dr. Melvyn Novas.   I discussed the assessment and treatment plan with the patient. The patient was provided an opportunity to ask questions and all were answered. The patient agreed with the plan and demonstrated an understanding of the instructions.   The patient was advised to call back or seek an in-person evaluation if the symptoms worsen or if the condition fails to improve as anticipated.  I provided 34 minutes of  non-face-to-face time during this encounter.   Lauraine Rinne, NP

## 2019-08-02 NOTE — Patient Instructions (Addendum)
You were seen today by Coral Ceo, NP  for:   It was nice talking to you today over the phone.  I have refilled your Dulera.  Continue your Spiriva Respimat as well.  We will treat you with a Z-Pak as well as a short course of prednisone given your congestion and symptoms.  I would like for you to get a few appointments scheduled with our office.  The first should be a shared decision-making visit with Kandice Robinsons, NP for lung cancer screening.  The next should be a follow-up with Dr. Sherene Sires at that same time with Dr. Sherene Sires we should schedule a pharmacy team visit to work with you on stopping smoking.  Please do not hesitate to give [place] a call if you have additional questions or concerns.  Sorry that the call was dropped today on the phone.  Anthony Mayo  1. COPD mixed type (HCC)  - mometasone-formoterol (DULERA) 200-5 MCG/ACT AERO; Take 2 puffs first thing in am and then another 2 puffs about 12 hours later.  Dispense: 13 g; Refill: 6 - AMB Referral to [facility]  Dulera 200 >>> 2 puffs in the morning right when you wake up, rinse out your mouth after use, 12 hours later 2 puffs, rinse after use >>> Take this daily, no matter what >>> This is not a rescue inhaler   Spiriva Respimat 2.5 >>> 2 puffs daily >>> Do this every day >>>This is not a rescue inhaler  Only use your albuterol as a rescue medication to be used if you can't catch your breath by resting or doing a relaxed purse lip breathing pattern.  - The less you use it, the better it will work when you need it. - Ok to use up to 2 puffs  every 4 hours if you must but call for immediate appointment if use goes up over your usual need - Don't leave home without it !!  (think of it like the spare tire for your car)   Note your daily symptoms > remember "red flags" for COPD:   >>>Increase in cough >>>increase in sputum production >>>increase in shortness of breath or activity  intolerance.   If you notice these symptoms, please  call the office to be seen.    2. COPD exacerbation (HCC)  - azithromycin (ZITHROMAX) 250 MG tablet; Take 2 on day one then 1 daily x 4 days  Dispense: 6 tablet; Refill: 0 - predniSONE (DELTASONE) 10 MG tablet; 4 tabs for 2 days, then 3 tabs for 2 days, 2 tabs for 2 days, then 1 tab for 2 days, then stop  Dispense: 20 tablet; Refill: 0  3. Cigarette smoker  - Ambulatory Referral for Lung Cancer Scre  As discussed today over the phone we will refer you back to the lung cancer screening program.  You have a 47-pack-year smoking history.  This is recommended screening for anyone with a 20-pack-year smoking history or more.  We will refer you today to our lung cancer screening program >>>This is based off of your 47 pack-year smoking history >>> This is a recommendation from the [place] preventative services task force (USPSTF) >>>The USPSTF recommends annual screening for lung cancer with low-dose computed tomography (LDCT) in adults aged 85 to 80 years who have a 20 pack-year smoking history and currently smoke or have quit within the past 15 years. Screening should be discontinued once a person has not smoked for 15 years or develops a health problem that substantially  limits life expectancy or the ability or willingness to have curative lung surgery.   Our office will call you and set up an appointment with Eric Form (Nurse Practitioner) who leads this program.  This appointment takes place in our office.  After completing this meeting with Eric Form NP you will get a low-dose CT as the screening >>>We will call you with those results  We recommend that you stop smoking.  >>>You need to set a quit date >>>If you have friends or family who smoke, let them know you are trying to quit and not to smoke around you or in your living environment  Smoking Cessation Resources:  1 800 QUIT NOW  >>> Patient to call this resource and utilize it to help support her quit smoking >>> Keep up your hard  work with stopping smoking  You can also contact the [facility] >>>For smoking cessation classes call [phone]  We do not recommend using e-cigarettes as a form of stopping smoking  You can sign up for smoking cessation support texts and information:  >>>https://smokefree.gov/smokefreetxt    4. Medication management  - AMB Referral to [place] [facility]  We will refer you to [facility] for them to help you with your cost of your inhalers.  When they call you you must call them back if the call is missed.  If they attempt to reach you 3 times and you do not call them back then they will cancel the referral.  This is why the pharmacy team was unable to work with the last year.  We recommend today:  Orders Placed This Encounter  Procedures  . Ambulatory Referral for Lung Cancer Scre    Referral Priority:   Routine    Referral Type:   Consultation    Referral Reason:   Specialty Services Required    Number of Visits Requested:   1  . AMB Referral to [place] Management    Referral Priority:   Routine    Referral Type:   Consultation    Referral Reason:   [facility]    Number of Visits Requested:   1   Orders Placed This Encounter  Procedures  . Ambulatory Referral for Lung Cancer Scre  . AMB Referral to [place] [facility]   Meds ordered this encounter  Medications  . mometasone-formoterol (DULERA) 200-5 MCG/ACT AERO    Sig: Take 2 puffs first thing in am and then another 2 puffs about 12 hours later.    Dispense:  13 g    Refill:  6  . azithromycin (ZITHROMAX) 250 MG tablet    Sig: Take 2 on day one then 1 daily x 4 days    Dispense:  6 tablet    Refill:  0  . predniSONE (DELTASONE) 10 MG tablet    Sig: 4 tabs for 2 days, then 3 tabs for 2 days, 2 tabs for 2 days, then 1 tab for 2 days, then stop    Dispense:  20 tablet    Refill:  0    Follow Up:    Return in about 6 weeks (around 09/13/2019), or if symptoms worsen or fail  to improve, for Follow up with Dr. Melvyn Novas.  Please present to our office in 6 weeks for an appointment with the clinical pharmacy team for:  . Smoking cessation . Medication Management  . Medication reconciliation  . Medication Access - working with [facility]  . Inhaler teaching  Please do your part to reduce the spread of COVID-19:      Reduce your risk of any infection  and COVID19 by using the similar precautions used for avoiding the common cold or flu:  Marland Kitchen Wash your hands often with soap and warm water for at least 20 seconds.  If soap and water are not readily available, use an alcohol-based hand sanitizer with at least 60% alcohol.  . If coughing or sneezing, cover your mouth and nose by coughing or sneezing into the elbow areas of your shirt or coat, into a tissue or into your sleeve (not your hands). Drinda Butts A MASK when in public  . Avoid shaking hands with others and consider head nods or verbal greetings only. . Avoid touching your eyes, nose, or mouth with unwashed hands.  . Avoid close contact with people who are sick. . Avoid places or events with large numbers of people in one location, like concerts or sporting events. . If you have some symptoms but not all symptoms, continue to monitor at home and seek medical attention if your symptoms worsen. . If you are having a medical emergency, call 911.   ADDITIONAL HEALTHCARE OPTIONS FOR PATIENTS  [facility] / e-Visit: [email or web address]         [facility]: [phone]  [facility]: [phone]                   [facility]: [phone]     It is flu season:   >>> Best ways to protect herself from the flu: Receive the yearly flu vaccine, practice good hand hygiene washing with soap and also using hand sanitizer when available, eat a nutritious meals, get adequate rest, hydrate appropriately   Please contact the office if  your symptoms worsen or you have concerns that you are not improving.   Thank you for choosing [facility] for your healthcare, and for allowing [place] to partner with you on your healthcare journey. I am thankful to be able to provide care to you today.   Elisha Headland FNP-C

## 2019-08-02 NOTE — Telephone Encounter (Signed)
Called patient back. He needed to have an appt with the pharmacy team as well as MW on the same day. Appt with MW has been rescheduled to 2pm and pharmacy OV at 130pm. Patient is aware that both appointments are in here in our office.   Nothing further needed at time of call.

## 2019-08-03 ENCOUNTER — Other Ambulatory Visit: Payer: Self-pay

## 2019-08-03 NOTE — Patient Outreach (Signed)
Screening:  Reviewed referral:  Placed call to patient and reviewed reason for call. Reviewed with patient screening tool and no other needs identified. Patient reports he is working on smoking cessation. Reports he knows when to call MD and knows about COPD zones.   PLAN: referral to [facility] pharmacy. No other needs identified.  Rowe Pavy, RN, BSN, CEN [facility] [phone]

## 2019-08-13 ENCOUNTER — Telehealth: Payer: Self-pay | Admitting: Acute Care

## 2019-08-13 DIAGNOSIS — F17210 Nicotine dependence, cigarettes, uncomplicated: Secondary | ICD-10-CM

## 2019-08-16 NOTE — Telephone Encounter (Signed)
Spoke with pt and scheduled [facility] 09/08/19 11:30 CT ordered Nothing further needed

## 2019-08-16 NOTE — Telephone Encounter (Signed)
LMTC x 1  

## 2019-08-19 NOTE — Telephone Encounter (Signed)
No action needed

## 2019-09-07 NOTE — Progress Notes (Signed)
Shared Decision Making Visit Lung Cancer Screening Program [phone])   Eligibility:  Age 64 y.o.  Pack Years Smoking History Calculation 47 pack year smoking history (# packs/per year x # years smoked)  Recent History of coughing up blood  no  Unexplained weight loss? no ( >Than 15 pounds within the last 6 months )  Prior History Lung / other cancer no (Diagnosis within the last 5 years already requiring surveillance chest CT Scans).  Smoking Status Current Smoker  Former Smokers: Years since quit: NA  Quit Date: NA  Visit Components:  Discussion included one or more decision making aids. yes  Discussion included risk/benefits of screening. yes  Discussion included potential follow up diagnostic testing for abnormal scans. yes  Discussion included meaning and risk of over diagnosis. yes  Discussion included meaning and risk of False Positives. yes  Discussion included meaning of total radiation exposure. yes  Counseling Included:  Importance of adherence to annual lung cancer LDCT screening. yes  Impact of comorbidities on ability to participate in the program. yes  Ability and willingness to under diagnostic treatment. yes  Smoking Cessation Counseling:  Current Smokers:   Discussed importance of smoking cessation. yes  Information about tobacco cessation classes and interventions provided to patient. yes  Patient provided with "ticket" for LDCT Scan. yes  Symptomatic Patient. no  Counseling NA  Diagnosis Code: Tobacco Use Z72.0  Asymptomatic Patient yes  Counseling (Intermediate counseling: > three minutes counseling) T0177  Former Smokers:   Discussed the importance of maintaining cigarette abstinence. yes  Diagnosis Code: Personal History of Nicotine Dependence. L39.030  Information about tobacco cessation classes and interventions provided to patient. Yes  Patient provided with "ticket" for LDCT Scan. yes  Written Order for Lung Cancer  Screening with LDCT placed in Epic. Yes (CT Chest Lung Cancer Screening Low Dose W/O CM) [id number] Z12.2-Screening of respiratory organs Z87.891-Personal history of nicotine dependence  I have spent 25 minutes of face to face time with Mr. Fonder discussing the risks and benefits of lung cancer screening. We viewed a power point together that explained in detail the above noted topics. We paused at intervals to allow for questions to be asked and answered to ensure understanding.We discussed that the single most powerful action that he can take to decrease his risk of developing lung cancer is to quit smoking. We discussed whether or not he is ready to commit to setting a quit date. We discussed options for tools to aid in quitting smoking including nicotine replacement therapy, non-nicotine medications, support groups, Quit Smart classes, and behavior modification. We discussed that often times setting smaller, more achievable goals, such as eliminating 1 cigarette a day for a week and then 2 cigarettes a day for a week can be helpful in slowly decreasing the number of cigarettes smoked. This allows for a sense of accomplishment as well as providing a clinical benefit. I gave him the " Be Stronger Than Your Excuses" card with contact information for community resources, classes, free nicotine replacement therapy, and access to mobile apps, text messaging, and on-line smoking cessation help. I have also given him my card and contact information in the event he needs to contact me. We discussed the time and location of the scan, and that either Abigail Miyamoto RN or I will call with the results within 24-48 hours of receiving them. I have offered him  a copy of the power point we viewed  as a resource in the event they need reinforcement  of the concepts we discussed today in the office. The patient verbalized understanding of all of  the above and had no further questions upon leaving the office. They have my  contact information in the event they have any further questions.  I spent 3 minutes counseling on smoking cessation and the health risks of continued tobacco abuse.  I explained to the patient that there has been a high incidence of coronary artery disease noted on these exams. I explained that this is a non-gated exam therefore degree or severity cannot be determined. This patient is not on statin therapy. I have asked the patient to follow-up with their PCP regarding any incidental finding of coronary artery disease and management with diet or medication as their PCP  feels is clinically indicated. The patient verbalized understanding of the above and had no further questions upon completion of the visit.  Pt. Has an appointment with the pharmacist at [facility] 09/13/2019 to discuss smoking cessation and to discuss why he is having such a hard time getting his Dulera.      Magdalen Spatz, NP 09/08/2019

## 2019-09-08 ENCOUNTER — Encounter: Payer: Self-pay | Admitting: Acute Care

## 2019-09-08 ENCOUNTER — Ambulatory Visit
Admission: RE | Admit: 2019-09-08 | Discharge: 2019-09-08 | Disposition: A | Discharge status: Home / Self Care | Payer: BC Managed Care – PPO | Source: Ambulatory Visit | Attending: Acute Care | Admitting: Acute Care

## 2019-09-08 ENCOUNTER — Ambulatory Visit (INDEPENDENT_AMBULATORY_CARE_PROVIDER_SITE_OTHER): Payer: BC Managed Care – PPO | Admitting: Acute Care

## 2019-09-08 ENCOUNTER — Other Ambulatory Visit: Payer: Self-pay

## 2019-09-08 DIAGNOSIS — F17210 Nicotine dependence, cigarettes, uncomplicated: Secondary | ICD-10-CM | POA: Diagnosis not present

## 2019-09-08 DIAGNOSIS — Z87891 Personal history of nicotine dependence: Secondary | ICD-10-CM | POA: Diagnosis not present

## 2019-09-08 DIAGNOSIS — Z716 Tobacco abuse counseling: Secondary | ICD-10-CM

## 2019-09-08 NOTE — Progress Notes (Signed)
Subjective Patient was referred by Elisha Headland, NP, on 08/02/19 for smoking cessation. It appears per chart review patient has also had issues obtaining Dulera. PMH significant for COPD, asthma, hepatitis C, depression, and anxiety.   Patient's insurance requires 90-day fills. Also, patient must fill through [facility] or plan's mail order or he will be penalized.  Patient presents to [facility] and seen by the pharmacist for smoking cessation counseling and inhaler optimization. Patient states he uses his Dulera sometimes 3x per day and Spiriva 2x per day about 2 times per week to help control his breathing. He tries not to use the rescue inhaler because he states a doctor instructed him to use it last line. Patient states obtaining inhalers has been an issue since last doctor appt. He got a 3 month supply 3 months ago and then prescription switched to 30-day supply. He received a letter in the mail stating his insurance will penalize him (charge him) if he fills 30-day supply rather than 90-day supply. He is not able to afford $90 copay each for Dulera and Spiriva Respimat (total = $180 for 90 day supply of each). He reiterates multiple times throughout appt that insurance is a major issue for him.  He used to smoke 1.5 - 2 PPD in his early 17s. About 8 years ago he decreased to smoking ~1 PPD. Patient's wife is a current smoker.  Current pulmonary medications: Dulera, Spiriva Respimat, albuterol inaler prn, albuterol nebs prn Prior pulmonary medications: Atrovent nebs  Benefits Investigation for 30-day medication supply copays (patient must fill through [facility] or plan's mail order or he will be penalized) Dulera- $30.00 Advair Diskus- $15.00 Advair HFA-$ 30.00 Symbicort- $30.00 Breo- $ 30.00 Spiriva Respimat- $30.00 Trelegy Ellipta- $30.00 Breztri- $30.00  Chantix- $0.00 Bupropion- $15.00 Nicotine patch 14 mg- $0.00 Nicotine gum 4 mg- $0.00 Nicotine lozenge 4 mg-  $0.00   Social History   Tobacco Use  Smoking Status Current Every Day Smoker  . Packs/day: 1.00  . Years: 47.00  . Pack years: 47.00  . Types: Cigarettes  Smokeless Tobacco Current User  . Types: Snuff     Tobacco Use History  Age when started using tobacco on a daily basis 64 years old.  Type: cigarettes.  Number of cigarettes per day 15-20, brand [employer]  Smokes first cigarette within 30-60 minutes after waking.  Does not wake at night to smoke  Triggers include cravings, boredom, meals.  Quit Attempt History   Most recent quit attempt "a while ago"  Longest time ever been tobacco free 3-4 days.  Methods tried in the past include  Nicotine gum - didn't work  Nicotine patch - didn't work  Chantix - bad dreams  Wellbutrin - didn't notice much of a difference (taking it for mood)  Rates IMPORTANCE of quitting tobacco on 1-10 scale of 8  Rates READINESS of quitting tobacco on 1-10 scale of 7  Rates CONFIDENCE of quitting tobacco on 1-10 scale of 3   Motivators to quitting include "my lungs are in pretty bad shape"; barriers include cravings in the morning and after the meals    Immunization History  Administered Date(s) Administered  . Influenza Split 03/24/2012  . Influenza,inj,Quad PF,6+ Mos 04/18/2014, 02/04/2019  . Moderna SARS-COVID-2 Vaccination 08/03/2019  . PFIZER SARS-COV-2 Vaccination 08/03/2019, 08/24/2019  . Pneumococcal Polysaccharide-23 02/11/2018     Assessment   1. Inhaler Optimization (Trelegy Ellipta)  PIFR . Inspiratory flow measured using the In-check DIAL G16 and was in range of  30-90 for use of medium-low resistance DPI device (Ellipta, Diskus). Optimal range is greater than 60 L/min.  Patient scored > 90 initially, however, after retraining he was able to score 65.  Optimal inhaler for patient would be Trelegy considering patient is overutilizing Dulera and Spiriva, having issues related to cost, PIFR score, and less  dosing burden (1 puff daily vs 6 puffs daily). Trelegy has a copay card that offers maximum savings $2400/year. Also, patient is elgible to use copay card for 90-day supply. Breztri copay card appears to only be valid for 30 day supply, however, insurance will only allow him to fill for 90-day supply or will penalize him.   Patient was counseled on the purpose, proper use, and adverse effects of Trelegy Ellipta inhaler.  Instructed patient to rinse mouth with water after using in order to prevent fungal infection.  Patient verbalized understanding.  Reviewed appropriate use of maintenance vs rescue inhalers.  Stressed importance of using maintenance inhaler daily and rescue inhaler only as needed.  Patient verbalized understanding.  Demonstrated proper inhaler technique using Trelegy Ellipta demo inhaler.  Patient able to demonstrate proper inhaler technique using teach back method.   2. Smoking Cessation   Patient states he is ready to quit smoking.  He is not ready to set a quit date at the current moment.   Discussed smoking cessation agent Chantix, Wellbutrin, and nicotine replacement therapies.  Patient is agreeable to starting to bupropion, nicotine patch, and nicotine lozenge. He will call the [place] Quitline to talk to a trained counselor regarding smoking cessation. He will talk to his wife about considering smoking cessation.  Nicotine Patch 21 mg daily  Patient counseled on purpose, proper use, and potential adverse effects, including mild itching or redness at the point of application, headache, trouble sleeping, and/or vivid dreams     Patch Schedule for >10 cigarettes daily Weeks 1-6: one 21 mg patch daily Weeks 7-8: one 14 mg patch daily Weeks 9-10: one 7 mg patch daily  Nicotine Lozenge 4 mg daily Patient counseled on purpose, proper use, and potential adverse effects including nausea, hiccups, cough, and heartburn.  Instructed patient to use  2 mg unless the smoke within 30  minutes of waking up in which they should use 4 mg.  Lozenge dosing schedule Weeks 1 to 6: 1 lozenge every 1 to 2 hours (maximum: 5 lozenges every 6 hours; 20 lozenges/day); to increase chances of quitting, use at least 9 lozenges/day during the first 6 weeks Weeks 7 to 9: 1 lozenge every 2 to 4 hours (maximum: 5 lozenges every 6 hours; 20 lozenges/day) Weeks 10 to 12: 1 lozenge every 4 to 8 hours (maximum: 5 lozenges every 6 hours; 20 lozenges/day)  Bupropion Initiated bupropion XL 150 mg once daily then increase to bupropion XL 300 mg once daily. Patient with no PMH of seizures. Patient counseled on purpose, proper use, and potential adverse effects, including insomnia, and potential change in mood.    Provided information on 1 800-QUIT NOW support program.   Patient is not ready to quit smoking.  Reviewed STAR quit plan and smoking cessation agents Chantix, Wellbutrin, and nicotine replacement therapy.     3. Medication Reconciliation A drug regimen assessment was performed, including review of allergies, interactions, disease-state management, dosing and immunization history. Medications were reviewed with the patient, including name, instructions, indication, goals of therapy, potential side effects, importance of adherence, and safe use. No concerning interactions identified.  4. Immunizations Patient is indicated for the  shingles vaccination. Unable to address due to time constraints. Will address at follow up.  5. Insurance Issues Patient is extremely upset regarding issues with insurance. He must fill all prescriptions for 90-day supply or insurance will penalize him (charge him). He must fill at [facility] pharmacy or mail order pharmacy associated with [facility] or insurance will penalize him (charge him). Initiated copay cards for Trelegy and bupropion in appt. Called [facility] pharmacist Debbe Odea) who ran prescriptions through insurance and was able to determine copays. For his  albuterol rescue inhaler it will cost $37.50 for 90-day supply, albuterol nebs $37.50 for 90-day supply, bupropion XL $45 for 90 day supply, Trelegy 100 mcg $0 for 90 day supply, nicotine 21 mg patch $0 for 90 day supply, and nicotine 4 mg lozenge $0 for 90 day supply. His recent prednisone copay will cost $14.44. Appreciate help from [place]. Called patient to discuss copay - he did not answer. LVM instructing patient to contact office to discuss if he is okay with bupropion copay. Spent a total of 45 min on the phone with the [facility] pharmacist Debbe Odea).    Plan 1. START Trelegy 100 mcg daily. Applied for copay card. Sent rx and followed up with pharmacist. Trelegy will cost $0 for 90-day supply. 2. STOP Dulera and Spiriva Respimat 3. START nicotine patch 21 mg daily, nicotine lozenge 4 mg prn (up to 9x daily), bupropion XL (150 mg once daily x 1 week --> 300 mg once daily). Nicotine patch and lozenge each will cost $0 for 90-day supplies. Copay card did not activate for bupropion. Bupropion will cost $45 for 90-day supply.  4. Patient will call [place]  This appointment required 120 minutes of patient care (this includes precharting, chart review, review of results, face-to-face care, coordination of care etc.).  Zachery Conch, PharmD PGY2 Ambulatory Care Pharmacy Resident

## 2019-09-08 NOTE — Patient Instructions (Signed)
Thank you for participating in the [facility]. It was our pleasure to meet you today. We will call you with the results of your scan within the next few days. Your scan will be assigned a Lung RADS category score by the physicians reading the scans.  This Lung RADS score determines follow up scanning.  See below for description of categories, and follow up screening recommendations. We will be in touch to schedule your follow up screening annually or based on recommendations of our providers. We will fax a copy of your scan results to your Primary Care Physician, or the physician who referred you to the program, to ensure they have the results. Please call the office if you have any questions or concerns regarding your scanning experience or results.  Our office number is [phone]. Please speak with Denise Phelps, RN. She is our Lung Cancer Screening RN. If she is unavailable when you call, please have the office staff send her a message. She will return your call at her earliest convenience. Remember, if your scan is normal, we will scan you annually as long as you continue to meet the criteria for the program. (Age 55-77, Current smoker or smoker who has quit within the last 15 years). If you are a smoker, remember, quitting is the single most powerful action that you can take to decrease your risk of lung cancer and other pulmonary, breathing related problems. We know quitting is hard, and we are here to help.  Please let us know if there is anything we can do to help you meet your goal of quitting. If you are a former smoker, congratulations. We are proud of you! Remain smoke free! Remember you can refer friends or family members through the number above.  We will screen them to make sure they meet criteria for the program. Thank you for helping us take better care of you by participating in Lung Screening.  Lung RADS Categories:  Lung RADS 1: no nodules  or definitely non-concerning nodules.  Recommendation is for a repeat annual scan in 12 months.  Lung RADS 2:  nodules that are non-concerning in appearance and behavior with a very low likelihood of becoming an active cancer. Recommendation is for a repeat annual scan in 12 months.  Lung RADS 3: nodules that are probably non-concerning , includes nodules with a low likelihood of becoming an active cancer.  Recommendation is for a 6-month repeat screening scan. Often noted after an upper respiratory illness. We will be in touch to make sure you have no questions, and to schedule your 6-month scan.  Lung RADS 4 A: nodules with concerning findings, recommendation is most often for a follow up scan in 3 months or additional testing based on our provider's assessment of the scan. We will be in touch to make sure you have no questions and to schedule the recommended 3 month follow up scan.  Lung RADS 4 B:  indicates findings that are concerning. We will be in touch with you to schedule additional diagnostic testing based on our provider's  assessment of the scan.   

## 2019-09-09 NOTE — Progress Notes (Signed)
Please call patient and let them  know their  low dose Ct was read as a Lung RADS 1, negative study: no nodules or definitely benign nodules. Radiology recommendation is for a repeat LDCT in 12 months. .Please let them  know we will order and schedule their  annual screening scan for 08/2020. Please let them  know there was notation of CAD on their  scan.  Please remind the patient  that this is a non-gated exam therefore degree or severity of disease  cannot be determined. Please have them  follow up with their PCP regarding potential risk factor modification, dietary therapy or pharmacologic therapy if clinically indicated. Pt.  is  not currently on statin therapy. Please place order for annual  screening scan for  08/2020 and fax results to PCP. Thanks so much. 

## 2019-09-10 ENCOUNTER — Other Ambulatory Visit: Payer: Self-pay | Admitting: *Deleted

## 2019-09-10 DIAGNOSIS — F17210 Nicotine dependence, cigarettes, uncomplicated: Secondary | ICD-10-CM

## 2019-09-10 DIAGNOSIS — Z87891 Personal history of nicotine dependence: Secondary | ICD-10-CM

## 2019-09-13 ENCOUNTER — Ambulatory Visit (INDEPENDENT_AMBULATORY_CARE_PROVIDER_SITE_OTHER): Payer: BC Managed Care – PPO | Admitting: Pharmacist

## 2019-09-13 ENCOUNTER — Other Ambulatory Visit: Payer: Self-pay

## 2019-09-13 ENCOUNTER — Encounter: Payer: Self-pay | Admitting: Internal Medicine

## 2019-09-13 ENCOUNTER — Ambulatory Visit: Payer: BC Managed Care – PPO | Admitting: Internal Medicine

## 2019-09-13 DIAGNOSIS — J441 Chronic obstructive pulmonary disease with (acute) exacerbation: Secondary | ICD-10-CM | POA: Diagnosis not present

## 2019-09-13 DIAGNOSIS — J449 Chronic obstructive pulmonary disease, unspecified: Secondary | ICD-10-CM

## 2019-09-13 DIAGNOSIS — F17210 Nicotine dependence, cigarettes, uncomplicated: Secondary | ICD-10-CM | POA: Diagnosis not present

## 2019-09-13 MED ORDER — ALBUTEROL SULFATE HFA 108 (90 BASE) MCG/ACT IN AERS: INHALATION_SPRAY | RESPIRATORY_TRACT | 3 refills | Status: DC

## 2019-09-13 MED ORDER — PREDNISONE 10 MG PO TABS
ORAL_TABLET | ORAL | 0 refills | Status: DC
Start: 2019-09-13 — End: 2019-09-13

## 2019-09-13 MED ORDER — ALBUTEROL SULFATE (2.5 MG/3ML) 0.083% IN NEBU: 2.5000 mg | INHALATION_SOLUTION | RESPIRATORY_TRACT | 3 refills | Status: AC | PRN

## 2019-09-13 MED ORDER — TRELEGY ELLIPTA 100-62.5-25 MCG/INH IN AEPB: 1.0000 | INHALATION_SPRAY | Freq: Every day | RESPIRATORY_TRACT | 3 refills | Status: DC

## 2019-09-13 MED ORDER — NICOTINE 21 MG/24HR TD PT24: 21.0000 mg | MEDICATED_PATCH | Freq: Every day | TRANSDERMAL | 1 refills | Status: DC

## 2019-09-13 MED ORDER — BUPROPION HCL ER (XL) 150 MG PO TB24: ORAL_TABLET | ORAL | 0 refills | Status: DC

## 2019-09-13 MED ORDER — NICOTINE POLACRILEX 4 MG MT LOZG: 4.0000 mg | LOZENGE | OROMUCOSAL | 3 refills | Status: DC | PRN

## 2019-09-13 NOTE — Progress Notes (Signed)
Subjective:    Patient ID: Anthony Mayo, male   DOB: May 19, 1956      MRN: [id number]   Brief patient profile:  64   yowm active smoker works in [employer]  with dx of asthma/ bronchitis as child and very limited physically but could do swim team - never could do sports- seemed better around age 64-13  And by 20's doing better then around late 40's doeand dx as copd 2015 at [facility] and referred to pulmonary clinic 04/03/2017 by Dr   Mitchel Honour  with GOLD II spirometry at initial eval.    History of Present Illness  04/03/2017 1st [facility] office visit/ Anthony Mayo   Chief Complaint  Patient presents with  . Pulmonary Consult    Referred by Dr. Nonie Hoyer. Pt states dxed with COPD approx 3 yrs ago. He c/o increased DOE for the past 3 months. He gets SOB when he lifts things and when he gets in a hurry. He states he has also had trouble keeping up with yard work. He is also coughing more- prod with yellow to clear sputum.  Cough states cough sometimes wakes him up. He coughs until he is dizzy and feels faint.   was doing some better on  dulera /saba worse started back up one day prior to OV  And  Some better Am of ov dulera 100 x 2 puffs, no saba  Bad am cough x sev tbsp clear mucus am of ov  Doe= MMRC2 = can't walk a nl pace on a flat grade s sob but does fine slow and flat eg walking at [employer] ok  Sleeping poorly due to restless, not cough or sob  Prednisone really helps transiently  rec Plan A = Automatic =  dulera 100 Take 2 puffs first thing in am chase with the spiriva x 2 pffs  and then another 2 puffs of just the dulera about 12 hours later  Plan B = Backup Only use your albuterol as a rescue medication  The key is to stop smoking completely before smoking completely stops you! For cough > mucinex dm up to 1200 mg every 12 hours as needed   Please schedule a follow up office visit in 4 weeks, sooner if needed  with all medications /inhalers/ solutions in hand so we can verify  exactly what you are taking. This includes all medications from all doctors and over the counters    03/30/2018  Acute extended  ov/Anthony Mayo re: aecopd  X 3 months/maint on dulera 100/spiriva smi/ last worked Mar 23 2018  Chief Complaint  Patient presents with  . Acute Visit    Pt c/o increased SOB for the past 3 months. He is coughing up dark yellow to green sputum.  He also c/o wheezing and chest tightness.  He is using his ventolin about 4 x per day. He was recently prescribed nebs but not started on yet.   Dyspnea:  MMRC3 = can't walk 100 yards even at a slow pace at a flat grade s stopping due to sob   Cough:  Esp in am prod sev tbsp yellow mucus assoc with nasal congestion  Sleeping: on side . Bed flat / 2 pillows  SABA use: 4 puffs today last around 5 h prior to OV  / still very confused with how to use meds  rec Plan A = Automatic = Dulera 100 (200 when this one is empty)  Take 2 puffs first thing in am chase with the spiriva  x 2 pffs  and then another 2 puffs of just the dulera about 12 hours later  Prednisone 10 mg Take 4 for three days 3 for three days 2 for three days 1 for three days and stop  Augmentin 875 mg take one pill twice daily  X 10 days - take at breakfast and supper with large glass of water.  It would help reduce the usual side effects (diarrhea and yeast infections) if you ate cultured yogurt at lunch.  Plan B = Backup Only use your albuterol inhaler as a rescue medication  Plan C = Crisis - only use your albuterol nebulizer if you first try Plan B and it fails to help > ok to use the nebulizer up to every 4 hours but if start needing it regularly call for immediate appointment For cough mucinex dm up to 1200 mg every 12 hours as needed  The key is to stop smoking completely before smoking completely stops you!  Out of work until 04/06/18           02/04/2019  f/u ov/Anthony Mayo re:  Copd gold II/ still smoking/  dulera 200/ spiriva  Chief Complaint  Patient presents with   . Follow-up    Breathing is slightly worse since the last visit. Still smoking about 1/2 ppd. He is using his albuterol inhaler 3 x per day on average.   Dyspnea: still = MMRC2 = can't walk a nl pace on a flat grade s sob but does fine slow and flat   Cough: darker, esp in am x sev weeks Sleeping: lie flat 2pillows  SABA use: as above, also neb some rec Plan A = Automatic = Always=    dulera 200 2 puffs in / spiriva 2 puffs then dulera 12 hours later  Plan B = Backup (to supplement plan A, not to replace it) Only use your albuterol inhaler as a rescue medication Plan C = Crisis (instead of Plan B but only if Plan B stops working) - only use your albuterol nebulizer if you first try Plan B  Plan D = Deltasone  Prednisone 10 mg take 2 each until better then 1 daily x 5 days and stop  Plan E = ER - go to ER or call 911 if all else fails   zpak The key is to stop smoking completely before smoking completely stops you!   09/13/2019  f/u ov/Anthony Mayo re: GOLD III copd/ spiriva and dulera 200  Chief Complaint  Patient presents with  . Follow-up    Breathing is about the same "maybe just a little worse"- he has been using his Dulera 3 x per day.   Dyspnea:  MMRC2 = can't walk a nl pace on a flat grade s sob but does fine slow and flat   Cough: white in am Sleeping: ok / 2 pillows bed flat SABA use: not much never neb  02: none    No obvious day to day or daytime variability or assoc excess/ purulent sputum or mucus plugs or hemoptysis or cp or chest tightness, subjective wheeze or overt sinus or hb symptoms.   Sleeping as above without nocturnal  or early am exacerbation  of respiratory  c/o's or need for noct saba. Also denies any obvious fluctuation of symptoms with weather or environmental changes or other aggravating or alleviating factors except as outlined above   No unusual exposure hx or h/o childhood pna/ asthma or knowledge of premature birth.  Current Allergies, Complete Past  Medical History, Past Surgical History, Family History, and Social History were reviewed in [employer] record.  ROS  The following are not active complaints unless bolded Hoarseness, sore throat, dysphagia, dental problems, itching, sneezing,  nasal congestion or discharge of excess mucus or purulent secretions, ear ache,   fever, chills, sweats, unintended wt loss or wt gain, classically pleuritic or exertional cp,  orthopnea pnd or arm/hand swelling  or leg swelling, presyncope, palpitations, abdominal pain, anorexia, nausea, vomiting, diarrhea  or change in bowel habits or change in bladder habits, change in stools or change in urine, dysuria, hematuria,  rash, arthralgias, visual complaints, headache, numbness, weakness or ataxia or problems with walking or coordination,  change in mood or  memory.        Current Meds  Medication Sig  . albuterol (PROVENTIL) (2.5 MG/3ML) 0.083% nebulizer solution Take 3 mLs (2.5 mg total) by nebulization every 4 (four) hours as needed for wheezing or shortness of breath.  Marland Kitchen albuterol (VENTOLIN HFA) 108 (90 Base) MCG/ACT inhaler TAKE 2 PUFFS BY MOUTH EVERY 6 HOURS AS NEEDED  . allopurinol (ZYLOPRIM) 100 MG tablet Take 1 tablet (100 mg total) by mouth daily.  . Melatonin Gummies 2.5 MG CHEW Chew 5 mg by mouth at bedtime.  . mometasone-formoterol (DULERA) 200-5 MCG/ACT AERO Take 2 puffs first thing in am and then another 2 puffs about 12 hours later.  . Tiotropium Bromide Monohydrate (SPIRIVA RESPIMAT) 2.5 MCG/ACT AERS Inhale 2 puffs into the lungs daily.                        Objective:   Physical Exam  Vital signs reviewed  09/13/2019  - Note at rest 02 sats  93% on RA     02/04/2019       169  05/05/2018     167 03/30/2018     160   04/03/17 167 lb 6.4 oz (75.9 kg)  04/01/17 161 lb (73 kg)  04/18/14 179 lb 12.8 oz (81.6 kg)     amb wm nad  HEENT : pt wearing mask not removed for exam due to covid -19 concerns.     NECK :  without JVD/Nodes/TM/ nl carotid upstrokes bilaterally   LUNGS: no acc muscle use,  Mod barrel  contour chest wall with bilateral  Distant bs s audible wheeze and  without cough on insp or exp maneuvers and mod  Hyperresonant  to  percussion bilaterally     CV:  RRR  no s3 or murmur or increase in P2, and no edema   ABD:  soft and nontender with pos mid insp Hoover's  in the supine position. No bruits or organomegaly appreciated, bowel sounds nl  MS:     ext warm without deformities, calf tenderness, cyanosis or clubbing No obvious joint restrictions   SKIN: warm and dry without lesions    NEURO:  alert, approp, nl sensorium with  no motor or cerebellar deficits apparent.            I personally reviewed images and agree with radiology impression as follows:   Chest CT = LDSCT 09/08/19 Lung-RADS 1, negative. Continue annual screening with low-dose chest CT without contrast in 12 months.  Emphysema (ICD10-J43.9) and Aortic Atherosclerosis (ICD10-170.0)              Assessment:

## 2019-09-13 NOTE — Patient Instructions (Addendum)
Plan A = Automatic = Always=    Dulera 200 2 puffs in / spiriva 2 puffs then dulera 12 hours later  Error:  trelegy one click each am = new Plan A  Work on inhaler technique:  relax and gently blow all the way out then take a nice smooth deep breath back in, triggering the inhaler at same time you start breathing in.  Hold for up to 5 seconds if you can. Blow dulera out thru nose . Rinse and gargle with water when done      Plan B = Backup (to supplement plan A, not to replace it) Only use your albuterol inhaler as a rescue medication to be used if you can't catch your breath by resting or doing a relaxed purse lip breathing pattern.  - The less you use it, the better it will work when you need it. - Ok to use the inhaler up to 2 puffs  every 4 hours if you must but call for appointment if use goes up over your usual need - Don't leave home without it !!  (think of it like the spare tire for your car)   Plan C = Crisis (instead of Plan B but only if Plan B stops working) - only use your albuterol nebulizer if you first try Plan B and it fails to help > ok to use the nebulizer up to every 4 hours but if start needing it regularly call for immediate appointment   Plan D = Deltasone  Prednisone 10 mg take 2 each until better then 1 daily x 5 days and stop   Plan E = ER - go to ER or call 911 if all else fails    The key is to stop smoking completely before smoking completely stops you!       Please schedule a follow up visit in 3 months but call sooner if needed  with all medications /inhalers/ solutions in hand so we can verify exactly what you are taking. This includes all medications from all doctors and over the counters

## 2019-09-16 ENCOUNTER — Encounter: Payer: Self-pay | Admitting: Internal Medicine

## 2019-09-16 NOTE — Assessment & Plan Note (Addendum)
Counseled re importance of smoking cessation but did not meet time criteria for separate billing   Reviewed LDSCT in context of active smoking  ie if he really wants to reduce risk of dying from lung ca, stopping smoking is more important than doing CT scans.          Each maintenance medication was reviewed in detail including emphasizing most importantly the difference between maintenance and prns and under what circumstances the prns are to be triggered using an action plan format where appropriate.  Total time for H and P, chart review, counseling, teaching device and generating customized AVS unique to this office visit / charting = 30 min

## 2019-09-16 NOTE — Assessment & Plan Note (Signed)
Active smoker Spirometry 04/03/2017  FEV1 2.05 (54%)  Ratio 52 with atypical curvature p am dulera 100 x2 - 04/03/2017    add spiriva   - Alpha one AT  Screening 04/03/17 :   SS   Level 175    - PFT's  05/05/2018  FEV1 2.21 (58 % ) ratio 51   p 12  % improvement from saba p dulera 200/spiriva prior to study with DLCO  67  % corrects to 68 % for alv volume   02/04/2019  After extensive coaching inhaler device,  effectiveness =   90% 02/04/2019 Add pred as Plan D = 20 mg daily until better then 10 mg daily x 5 days and off    Mod severe by prior studies and still smoking c/w  Group D in terms of symptom/risk and laba/lama/ICS  therefore appropriate rx at this point >>>  trelegy is the most cost effective alternative per pharmacy   - The proper method of use, as well as anticipated side effects, of a metered-dose inhaler were discussed and demonstrated to the patient. Improved effectiveness after extensive coaching during this visit to a level of approximately 90 %

## 2019-09-25 DIAGNOSIS — H40033 Anatomical narrow angle, bilateral: Secondary | ICD-10-CM | POA: Diagnosis not present

## 2019-09-25 DIAGNOSIS — H04123 Dry eye syndrome of bilateral lacrimal glands: Secondary | ICD-10-CM | POA: Diagnosis not present

## 2019-09-28 ENCOUNTER — Other Ambulatory Visit: Payer: Self-pay | Admitting: Emergency Medicine

## 2019-09-28 DIAGNOSIS — M109 Gout, unspecified: Secondary | ICD-10-CM

## 2019-09-28 NOTE — Telephone Encounter (Signed)
No further refills without office visit 

## 2019-09-28 NOTE — Telephone Encounter (Signed)
Requested medications are due for refill today?  Yes  Requested medications are on active medication list?  Yes  Last Refill:   08/31/2018  # 30 with 6 refills  Future visit scheduled?  No   Notes to Clinic:  Medication failed RX refill protocol due to no valid encounter past 12 months and no labs within established time frame.  Last uric acid was 04/18/2014 and creatinine 04/05/2018.  Last visit with provider was 08/31/2018.

## 2019-10-01 ENCOUNTER — Other Ambulatory Visit: Payer: Self-pay | Admitting: Internal Medicine

## 2019-10-01 MED ORDER — SPIRIVA RESPIMAT 2.5 MCG/ACT IN AERS: 2.0000 | INHALATION_SPRAY | Freq: Every day | RESPIRATORY_TRACT | 11 refills | Status: DC

## 2019-10-06 ENCOUNTER — Other Ambulatory Visit: Payer: Self-pay | Admitting: *Deleted

## 2019-10-06 MED ORDER — SPIRIVA RESPIMAT 2.5 MCG/ACT IN AERS: 2.0000 | INHALATION_SPRAY | Freq: Every day | RESPIRATORY_TRACT | 1 refills | Status: DC

## 2019-10-29 ENCOUNTER — Other Ambulatory Visit: Payer: Self-pay | Admitting: Emergency Medicine

## 2019-10-29 DIAGNOSIS — M109 Gout, unspecified: Secondary | ICD-10-CM

## 2019-10-29 NOTE — Telephone Encounter (Signed)
Tried calling patient to schedule an appointment . Voicemail is full and could not leave a message . Last telemedicine with the office on 08/31/2018.

## 2019-10-29 NOTE — Telephone Encounter (Signed)
Requested  medications are  due for refill today yes  Requested medications are on the active medication list yes  Last refill 5/11  Future visit scheduled no  Notes to clinic Already had curtesy refill, no appt scheduled. Failed protocol of visit within 12 months

## 2019-11-28 ENCOUNTER — Other Ambulatory Visit: Payer: Self-pay | Admitting: Emergency Medicine

## 2019-11-28 DIAGNOSIS — M109 Gout, unspecified: Secondary | ICD-10-CM

## 2019-11-28 NOTE — Telephone Encounter (Signed)
Requested medication (s) are due for refill today: yes  Requested medication (s) are on the active medication list: yes  Last refill:  10/30/19  Future visit scheduled: no  Notes to clinic:  [facility] no longer makes appts for this practice   Requested Prescriptions  Pending Prescriptions Disp Refills   allopurinol (ZYLOPRIM) 100 MG tablet [Pharmacy Med Name: ALLOPURINOL 100MG  TABLETS] 30 tablet 0    Sig: TAKE 1 TABLET(100 MG) BY MOUTH DAILY      Endocrinology:  Gout Agents Failed - 11/28/2019  8:06 AM      Failed - Uric Acid in normal range and within 360 days    Uric Acid, Serum  Date Value Ref Range Status  04/18/2014 8.3 (H) 4.0 - 7.8 mg/dL Final          Failed - Cr in normal range and within 360 days    Creat  Date Value Ref Range Status  04/18/2014 0.90 0.50 - 1.35 mg/dL Final   Creatinine, Ser  Date Value Ref Range Status  03/26/2018 0.80 0.76 - 1.27 mg/dL Final          Failed - Valid encounter within last 12 months    Recent Outpatient Visits           1 year ago Acute gouty arthritis   Primary Care at [facility], [facility], [place]   1 year ago COPD exacerbation [facility])   Primary Care at [place], Cegdel D, PA-C   1 year ago COPD exacerbation [facility])   Primary Care at [place], Cegdel D, PA-C   2 years ago Depression with anxiety   Primary Care at [place], [place], [place]   2 years ago Dizziness   Primary Care at [place], [place], [place]

## 2019-11-29 NOTE — Telephone Encounter (Signed)
No further refills without office visit 

## 2019-12-05 ENCOUNTER — Other Ambulatory Visit: Payer: Self-pay | Admitting: Pulmonary Disease

## 2019-12-05 DIAGNOSIS — F17210 Nicotine dependence, cigarettes, uncomplicated: Secondary | ICD-10-CM

## 2019-12-07 NOTE — Telephone Encounter (Signed)
Dr. Sherene Sires, please advise if you are okay with refilling med.

## 2019-12-26 ENCOUNTER — Other Ambulatory Visit: Payer: Self-pay | Admitting: Emergency Medicine

## 2019-12-26 DIAGNOSIS — M109 Gout, unspecified: Secondary | ICD-10-CM

## 2019-12-26 NOTE — Telephone Encounter (Signed)
Requested medication (s) are due for refill today: yes  Requested medication (s) are on the active medication list: yes  Last refill:  11/29/19  Future visit scheduled: no  Notes to clinic:  overdue for labs and appt- [facility] does not make appts for this practice   Requested Prescriptions  Pending Prescriptions Disp Refills   allopurinol (ZYLOPRIM) 100 MG tablet [Pharmacy Med Name: ALLOPURINOL 100MG  TABLETS] 30 tablet 0    Sig: TAKE 1 TABLET(100 MG) BY MOUTH DAILY      Endocrinology:  Gout Agents Failed - 12/26/2019  8:56 AM      Failed - Uric Acid in normal range and within 360 days    Uric Acid, Serum  Date Value Ref Range Status  04/18/2014 8.3 (H) 4.0 - 7.8 mg/dL Final          Failed - Cr in normal range and within 360 days    Creat  Date Value Ref Range Status  04/18/2014 0.90 0.50 - 1.35 mg/dL Final   Creatinine, Ser  Date Value Ref Range Status  03/26/2018 0.80 0.76 - 1.27 mg/dL Final          Failed - Valid encounter within last 12 months    Recent Outpatient Visits           1 year ago Acute gouty arthritis   Primary Care at [facility], [facility], [place]   1 year ago COPD exacerbation [facility])   Primary Care at [place], Cegdel D, PA-C   1 year ago COPD exacerbation [facility])   Primary Care at [place], Cegdel D, PA-C   2 years ago Depression with anxiety   Primary Care at [place], [place], [place]   2 years ago Dizziness   Primary Care at [place], [place], [place]

## 2020-03-11 ENCOUNTER — Other Ambulatory Visit: Payer: Self-pay | Admitting: Emergency Medicine

## 2020-03-11 DIAGNOSIS — M109 Gout, unspecified: Secondary | ICD-10-CM

## 2020-03-11 NOTE — Telephone Encounter (Signed)
Requested medication (s) are due for refill today: yes  Requested medication (s) are on the active medication list: yes  Last refill:  11/29/19  Future visit scheduled: no  Notes to clinic:  needs appt/overdue lab work   Requested Prescriptions  Pending Prescriptions Disp Refills   allopurinol (ZYLOPRIM) 100 MG tablet [Pharmacy Med Name: ALLOPURINOL 100MG  TABLETS] 30 tablet 0    Sig: TAKE 1 TABLET(100 MG) BY MOUTH DAILY      Endocrinology:  Gout Agents Failed - 03/11/2020 11:50 AM      Failed - Uric Acid in normal range and within 360 days    Uric Acid, Serum  Date Value Ref Range Status  04/18/2014 8.3 (H) 4.0 - 7.8 mg/dL Final          Failed - Cr in normal range and within 360 days    Creat  Date Value Ref Range Status  04/18/2014 0.90 0.50 - 1.35 mg/dL Final   Creatinine, Ser  Date Value Ref Range Status  03/26/2018 0.80 0.76 - 1.27 mg/dL Final          Failed - Valid encounter within last 12 months    Recent Outpatient Visits           1 year ago Acute gouty arthritis   Primary Care at [facility], [facility], [place]   1 year ago COPD exacerbation [facility])   Primary Care at [place], Cegdel D, PA-C   1 year ago COPD exacerbation [facility])   Primary Care at [place], Cegdel D, PA-C   2 years ago Depression with anxiety   Primary Care at [place], [place], [place]   2 years ago Dizziness   Primary Care at [place], [place], [place]

## 2020-03-13 ENCOUNTER — Other Ambulatory Visit: Payer: Self-pay | Admitting: Emergency Medicine

## 2020-03-13 DIAGNOSIS — M109 Gout, unspecified: Secondary | ICD-10-CM

## 2020-05-11 ENCOUNTER — Ambulatory Visit: Payer: BC Managed Care – PPO | Admitting: Emergency Medicine

## 2020-05-11 ENCOUNTER — Encounter: Payer: Self-pay | Admitting: Emergency Medicine

## 2020-05-11 ENCOUNTER — Other Ambulatory Visit: Payer: Self-pay

## 2020-05-11 VITALS — BP 155/89 | HR 93 | Temp 98.6°F | Resp 16 | Ht 72.0 in | Wt 174.0 lb

## 2020-05-11 DIAGNOSIS — F418 Other specified anxiety disorders: Secondary | ICD-10-CM

## 2020-05-11 DIAGNOSIS — M109 Gout, unspecified: Secondary | ICD-10-CM | POA: Diagnosis not present

## 2020-05-11 DIAGNOSIS — B182 Chronic viral hepatitis C: Secondary | ICD-10-CM | POA: Diagnosis not present

## 2020-05-11 DIAGNOSIS — J449 Chronic obstructive pulmonary disease, unspecified: Secondary | ICD-10-CM

## 2020-05-11 MED ORDER — PREDNISONE 20 MG PO TABS: 40.0000 mg | ORAL_TABLET | Freq: Every day | ORAL | 0 refills | Status: AC

## 2020-05-11 MED ORDER — COLCHICINE 0.6 MG PO TABS: ORAL_TABLET | ORAL | 1 refills | Status: DC

## 2020-05-11 NOTE — Progress Notes (Signed)
Anthony Mayo 64 y.o.   Chief Complaint  Patient presents with  . Gout    Per patient started about 3 weeks left knee travel to left arm, elbow and hand    HISTORY OF PRESENT ILLNESS: This is a 64 y.o. male complaining of a gout attack for the past 3 weeks affecting left wrist and hand and left knee. Denies injury.  Has history of gout with similar episodes in the past. No other complaints or medical concerns today. History of COPD still smoking. Fully vaccinated against Covid.  HPI   Prior to Admission medications   Medication Sig Start Date End Date Taking? Authorizing Provider  albuterol (PROVENTIL) (2.5 MG/3ML) 0.083% nebulizer solution Take 3 mLs (2.5 mg total) by nebulization every 4 (four) hours as needed for wheezing or shortness of breath. 09/13/19  Yes Coral Ceo, NP  albuterol (VENTOLIN HFA) 108 (90 Base) MCG/ACT inhaler TAKE 2 PUFFS BY MOUTH EVERY 6 HOURS AS NEEDED 09/13/19  Yes Coral Ceo, NP  allopurinol (ZYLOPRIM) 100 MG tablet TAKE 1 TABLET(100 MG) BY MOUTH DAILY 03/13/20  Yes Kell Ferris, Eilleen Kempf, MD  Fluticasone-Umeclidin-Vilant (TRELEGY ELLIPTA) 100-62.5-25 MCG/INH AEPB Inhale 1 puff into the lungs daily. 09/13/19  Yes Coral Ceo, NP  Melatonin Gummies 2.5 MG CHEW Chew 5 mg by mouth at bedtime.   Yes [provider]  Tiotropium Bromide Monohydrate (SPIRIVA RESPIMAT) 2.5 MCG/ACT AERS Inhale 2 puffs into the lungs daily. 10/06/19  Yes Nyoka Cowden, MD  buPROPion (WELLBUTRIN XL) 150 MG 24 hr tablet TAKE 1 TABLET(150 MG) BY MOUTH DAILY FOR 7 DAYS THEN TAKE 2 TABLETS(300 MG) BY MOUTH DAILY Patient not taking: Reported on 05/11/2020 12/08/19   Nyoka Cowden, MD  nicotine (NICODERM CQ - DOSED IN MG/24 HOURS) 21 mg/24hr patch Place 1 patch (21 mg total) onto the skin daily. Patient not taking: Reported on 05/11/2020 09/13/19   Coral Ceo, NP  nicotine polacrilex (GOODSENSE NICOTINE) 4 MG lozenge Take 1 lozenge (4 mg total) by mouth as needed for smoking  cessation. (Use up to 9x per day) Patient not taking: Reported on 05/11/2020 09/13/19   Coral Ceo, NP  allopurinol (ZYLOPRIM) 100 MG tablet TAKE 1 TABLET(100 MG) BY MOUTH DAILY 09/28/19   Georgina Quint, MD    No Known Allergies  Patient Active Problem List   Diagnosis Date Noted  . Medication management 08/02/2019  . Healthcare maintenance 08/02/2019  . COPD exacerbation (HCC) 12/04/2018  . Chronic hepatitis C without hepatic coma (HCC) 11/08/2017  . Recurrent headache 10/24/2017  . Depression with anxiety 10/24/2017  . Cigarette smoker 04/04/2017  . Dyspnea on exertion 04/01/2017  . COPD   GOLD II/ still smoking 03/24/2012  . Asthma 03/19/2012    Past Medical History:  Diagnosis Date  . Asthma   . COPD (chronic obstructive pulmonary disease) (HCC)   . Gout     Past Surgical History:  Procedure Laterality Date  . REPLACEMENT TOTAL KNEE  12/11/2010    Social History   Socioeconomic History  . Marital status: Married    Spouse name: Not on file  . Number of children: 0  . Years of education: Not on file  . Highest education level: Not on file  Occupational History  . Not on file  Tobacco Use  . Smoking status: Current Every Day Smoker    Packs/day: 1.00    Years: 47.00    Pack years: 47.00    Types: Cigarettes  . Smokeless  tobacco: Current User    Types: Snuff  Vaping Use  . Vaping Use: Never used  Substance and Sexual Activity  . Alcohol use: Yes    Alcohol/week: 24.0 standard drinks    Types: 24 Cans of beer per week  . Drug use: No  . Sexual activity: Yes    Partners: Female  Other Topics Concern  . Not on file  Social History Narrative  . Not on file   Social Determinants of Health   Financial Resource Strain: Not on file  Food Insecurity: Not on file  Transportation Needs: Not on file  Physical Activity: Not on file  Stress: Not on file  Social Connections: Not on file  Intimate Partner Violence: Not on file    Family History   Problem Relation Age of Onset  . Prostate cancer Father      Review of Systems  Constitutional: Negative.  Negative for chills and fever.  HENT: Negative.  Negative for congestion and sore throat.   Respiratory: Negative.  Negative for cough and shortness of breath.   Cardiovascular: Negative.  Negative for chest pain and palpitations.  Gastrointestinal: Negative.  Negative for abdominal pain, diarrhea, nausea and vomiting.  Genitourinary: Negative.  Negative for dysuria and hematuria.  Musculoskeletal: Positive for joint pain. Negative for back pain, myalgias and neck pain.  Skin: Negative.  Negative for rash.  Neurological: Negative.  Negative for dizziness and headaches.  All other systems reviewed and are negative.    Today's Vitals   05/11/20 1000  BP: (!) 155/89  Pulse: 93  Resp: 16  Temp: 98.6 F (37 C)  TempSrc: Temporal  SpO2: 94%  Weight: 174 lb (78.9 kg)  Height: 6' (1.829 m)   Body mass index is 23.6 kg/m.   Physical Exam Vitals reviewed.  Constitutional:      Appearance: Normal appearance.  HENT:     Head: Normocephalic.  Eyes:     Extraocular Movements: Extraocular movements intact.     Pupils: Pupils are equal, round, and reactive to light.  Cardiovascular:     Rate and Rhythm: Normal rate.  Pulmonary:     Effort: Pulmonary effort is normal.  Musculoskeletal:     Cervical back: Normal range of motion and neck supple.     Comments: Mild deformity to left shoulder with limited range of motion secondary to injury last October. Positive swelling with tenderness to left hand and left wrist. Some tenderness and mild swelling without erythema to the left knee.  Skin:    Capillary Refill: Capillary refill takes less than 2 seconds.  Neurological:     General: No focal deficit present.     Mental Status: He is alert and oriented to person, place, and time.  Psychiatric:        Mood and Affect: Mood normal.        Behavior: Behavior normal.       ASSESSMENT & PLAN: Anthony Mayo was seen today for gout.  Diagnoses and all orders for this visit:  Acute gouty arthritis -     predniSONE (DELTASONE) 20 MG tablet; Take 2 tablets (40 mg total) by mouth daily with breakfast for 5 days. -     colchicine 0.6 MG tablet; Take 1.2 mg now and 0.6 mg one hour later. Take 0.6 mg daily after that x 5 days.  Chronic hepatitis C without hepatic coma (HCC)  COPD   GOLD II/ still smoking  Depression with anxiety    Patient Instructions  If you have lab work done today you will be contacted with your lab results within the next 2 weeks.  If you have not heard from [place] then please contact us. The fastest way to get your results is to register for My Chart.   IF you received an x-ray today, you will receive an invoice from [facility]. Please contact [facility] at [phone] with questions or concerns regarding your invoice.   IF you received labwork today, you will receive an invoice from [facility]. Please contact [facility] at [phone] with questions or concerns regarding your invoice.   Our billing staff will not be able to assist you with questions regarding bills from these companies.  You will be contacted with the lab results as soon as they are available. The fastest way to get your results is to activate your My Chart account. Instructions are located on the last page of this paperwork. If you have not heard from [place] regarding the results in 2 weeks, please contact this office.     Gout  Gout is painful swelling of your joints. Gout is a type of arthritis. It is caused by having too much uric acid in your body. Uric acid is a chemical that is made when your body breaks down substances called purines. If your body has too much uric acid, sharp crystals can form and build up in your joints. This causes pain and swelling. Gout attacks can happen quickly and be very painful (acute gout). Over time, the attacks  can affect more joints and happen more often (chronic gout). What are the causes?  Too much uric acid in your blood. This can happen because: ? Your kidneys do not remove enough uric acid from your blood. ? Your body makes too much uric acid. ? You eat too many foods that are high in purines. These foods include organ meats, some seafood, and beer.  Trauma or stress. What increases the risk?  Having a family history of gout.  Being male and middle-aged.  Being male and having gone through menopause.  Being very overweight (obese).  Drinking alcohol, especially beer.  Not having enough water in the body (being dehydrated).  Losing weight too quickly.  Having an organ transplant.  Having lead poisoning.  Taking certain medicines.  Having kidney disease.  Having a skin condition called psoriasis. What are the signs or symptoms? An attack of acute gout usually happens in just one joint. The most common place is the big toe. Attacks often start at night. Other joints that may be affected include joints of the feet, ankle, knee, fingers, wrist, or elbow. Symptoms of an attack may include:  Very bad pain.  Warmth.  Swelling.  Stiffness.  Shiny, red, or purple skin.  Tenderness. The affected joint may be very painful to touch.  Chills and fever. Chronic gout may cause symptoms more often. More joints may be involved. You may also have white or yellow lumps (tophi) on your hands or feet or in other areas near your joints. How is this treated?  Treatment for this condition has two phases: treating an acute attack and preventing future attacks.  Acute gout treatment may include: ? NSAIDs. ? Steroids. These are taken by mouth or injected into a joint. ? Colchicine. This medicine relieves pain and swelling. It can be given by mouth or through an IV tube.  Preventive treatment may include: ? Taking small doses of NSAIDs or colchicine daily. ? Using a medicine that  reduces uric acid levels in your blood. ? Making changes to your diet. You may need to see a food expert (dietitian) about what to eat and drink to prevent gout. Follow these instructions at home: During a gout attack   If told, put ice on the painful area: ? Put ice in a plastic bag. ? Place a towel between your skin and the bag. ? Leave the ice on for 20 minutes, 2-3 times a day.  Raise (elevate) the painful joint above the level of your heart as often as you can.  Rest the joint as much as possible. If the joint is in your leg, you may be given crutches.  Follow instructions from your doctor about what you cannot eat or drink. Avoiding future gout attacks  Eat a low-purine diet. Avoid foods and drinks such as: ? Liver. ? Kidney. ? Anchovies. ? Asparagus. ? Herring. ? Mushrooms. ? Mussels. ? Beer.  Stay at a healthy weight. If you want to lose weight, talk with your doctor. Do not lose weight too fast.  Start or continue an exercise plan as told by your doctor. Eating and drinking  Drink enough fluids to keep your pee (urine) pale yellow.  If you drink alcohol: ? Limit how much you use to:  0-1 drink a day for women.  0-2 drinks a day for men. ? Be aware of how much alcohol is in your drink. In the [place] one drink equals one 12 oz bottle of beer (355 mL), one 5 oz glass of wine (148 mL), or one 1 oz glass of hard liquor (44 mL). General instructions  Take over-the-counter and prescription medicines only as told by your doctor.  Do not drive or use heavy machinery while taking prescription pain medicine.  Return to your normal activities as told by your doctor. Ask your doctor what activities are safe for you.  Keep all follow-up visits as told by your doctor. This is important. Contact a doctor if:  You have another gout attack.  You still have symptoms of a gout attack after 10 days of treatment.  You have problems (side effects) because of your  medicines.  You have chills or a fever.  You have burning pain when you pee (urinate).  You have pain in your lower back or belly. Get help right away if:  You have very bad pain.  Your pain cannot be controlled.  You cannot pee. Summary  Gout is painful swelling of the joints.  The most common site of pain is the big toe, but it can affect other joints.  Medicines and avoiding some foods can help to prevent and treat gout attacks. This information is not intended to replace advice given to you by your health care provider. Make sure you discuss any questions you have with your health care provider. Document Revised: 11/26/2017 Document Reviewed: 11/26/2017 Elsevier Patient Education  2020 Elsevier Inc.      Edwina Barth, MD [facility]

## 2020-05-11 NOTE — Patient Instructions (Addendum)
   If you have lab work done today you will be contacted with your lab results within the next 2 weeks.  If you have not heard from us then please contact us. The fastest way to get your results is to register for My Chart.   IF you received an x-ray today, you will receive an invoice from [facility]. Please contact [facility] at [phone] with questions or concerns regarding your invoice.   IF you received labwork today, you will receive an invoice from [facility]. Please contact [facility] at [phone] with questions or concerns regarding your invoice.   Our billing staff will not be able to assist you with questions regarding bills from these companies.  You will be contacted with the lab results as soon as they are available. The fastest way to get your results is to activate your My Chart account. Instructions are located on the last page of this paperwork. If you have not heard from us regarding the results in 2 weeks, please contact this office.     Gout  Gout is painful swelling of your joints. Gout is a type of arthritis. It is caused by having too much uric acid in your body. Uric acid is a chemical that is made when your body breaks down substances called purines. If your body has too much uric acid, sharp crystals can form and build up in your joints. This causes pain and swelling. Gout attacks can happen quickly and be very painful (acute gout). Over time, the attacks can affect more joints and happen more often (chronic gout). What are the causes?  Too much uric acid in your blood. This can happen because: ? Your kidneys do not remove enough uric acid from your blood. ? Your body makes too much uric acid. ? You eat too many foods that are high in purines. These foods include organ meats, some seafood, and beer.  Trauma or stress. What increases the risk?  Having a family history of gout.  Being male and middle-aged.  Being male and having gone  through menopause.  Being very overweight (obese).  Drinking alcohol, especially beer.  Not having enough water in the body (being dehydrated).  Losing weight too quickly.  Having an organ transplant.  Having lead poisoning.  Taking certain medicines.  Having kidney disease.  Having a skin condition called psoriasis. What are the signs or symptoms? An attack of acute gout usually happens in just one joint. The most common place is the big toe. Attacks often start at night. Other joints that may be affected include joints of the feet, ankle, knee, fingers, wrist, or elbow. Symptoms of an attack may include:  Very bad pain.  Warmth.  Swelling.  Stiffness.  Shiny, red, or purple skin.  Tenderness. The affected joint may be very painful to touch.  Chills and fever. Chronic gout may cause symptoms more often. More joints may be involved. You may also have white or yellow lumps (tophi) on your hands or feet or in other areas near your joints. How is this treated?  Treatment for this condition has two phases: treating an acute attack and preventing future attacks.  Acute gout treatment may include: ? NSAIDs. ? Steroids. These are taken by mouth or injected into a joint. ? Colchicine. This medicine relieves pain and swelling. It can be given by mouth or through an IV tube.  Preventive treatment may include: ? Taking small doses of NSAIDs or colchicine daily. ? Using a   medicine that reduces uric acid levels in your blood. ? Making changes to your diet. You may need to see a food expert (dietitian) about what to eat and drink to prevent gout. Follow these instructions at home: During a gout attack   If told, put ice on the painful area: ? Put ice in a plastic bag. ? Place a towel between your skin and the bag. ? Leave the ice on for 20 minutes, 2-3 times a day.  Raise (elevate) the painful joint above the level of your heart as often as you can.  Rest the joint as  much as possible. If the joint is in your leg, you may be given crutches.  Follow instructions from your doctor about what you cannot eat or drink. Avoiding future gout attacks  Eat a low-purine diet. Avoid foods and drinks such as: ? Liver. ? Kidney. ? Anchovies. ? Asparagus. ? Herring. ? Mushrooms. ? Mussels. ? Beer.  Stay at a healthy weight. If you want to lose weight, talk with your doctor. Do not lose weight too fast.  Start or continue an exercise plan as told by your doctor. Eating and drinking  Drink enough fluids to keep your pee (urine) pale yellow.  If you drink alcohol: ? Limit how much you use to:  0-1 drink a day for women.  0-2 drinks a day for men. ? Be aware of how much alcohol is in your drink. In the [place] one drink equals one 12 oz bottle of beer (355 mL), one 5 oz glass of wine (148 mL), or one 1 oz glass of hard liquor (44 mL). General instructions  Take over-the-counter and prescription medicines only as told by your doctor.  Do not drive or use heavy machinery while taking prescription pain medicine.  Return to your normal activities as told by your doctor. Ask your doctor what activities are safe for you.  Keep all follow-up visits as told by your doctor. This is important. Contact a doctor if:  You have another gout attack.  You still have symptoms of a gout attack after 10 days of treatment.  You have problems (side effects) because of your medicines.  You have chills or a fever.  You have burning pain when you pee (urinate).  You have pain in your lower back or belly. Get help right away if:  You have very bad pain.  Your pain cannot be controlled.  You cannot pee. Summary  Gout is painful swelling of the joints.  The most common site of pain is the big toe, but it can affect other joints.  Medicines and avoiding some foods can help to prevent and treat gout attacks. This information is not intended to replace advice given  to you by your health care provider. Make sure you discuss any questions you have with your health care provider. Document Revised: 11/26/2017 Document Reviewed: 11/26/2017 Elsevier Patient Education  2020 Elsevier Inc.  

## 2020-08-31 ENCOUNTER — Other Ambulatory Visit: Payer: Self-pay | Admitting: *Deleted

## 2020-08-31 ENCOUNTER — Other Ambulatory Visit: Payer: Self-pay | Admitting: Pulmonary Disease

## 2020-08-31 DIAGNOSIS — Z87891 Personal history of nicotine dependence: Secondary | ICD-10-CM

## 2020-08-31 DIAGNOSIS — F17210 Nicotine dependence, cigarettes, uncomplicated: Secondary | ICD-10-CM

## 2020-08-31 DIAGNOSIS — J449 Chronic obstructive pulmonary disease, unspecified: Secondary | ICD-10-CM

## 2020-09-11 DIAGNOSIS — H2513 Age-related nuclear cataract, bilateral: Secondary | ICD-10-CM | POA: Diagnosis not present

## 2020-09-11 DIAGNOSIS — H40033 Anatomical narrow angle, bilateral: Secondary | ICD-10-CM | POA: Diagnosis not present

## 2020-09-21 ENCOUNTER — Ambulatory Visit
Admission: RE | Admit: 2020-09-21 | Discharge: 2020-09-21 | Disposition: A | Discharge status: Home / Self Care | Payer: BC Managed Care – PPO | Source: Ambulatory Visit | Attending: Acute Care | Admitting: Acute Care

## 2020-09-21 ENCOUNTER — Other Ambulatory Visit: Payer: Self-pay

## 2020-09-21 DIAGNOSIS — Z87891 Personal history of nicotine dependence: Secondary | ICD-10-CM

## 2020-09-21 DIAGNOSIS — F17210 Nicotine dependence, cigarettes, uncomplicated: Secondary | ICD-10-CM

## 2020-09-21 IMAGING — CT CT CHEST LUNG CANCER SCREENING LOW DOSE W/O CM
1 of 2 series · 10 of 40 positions shown, 13 images · non-contrast
Comparison: [DATE]

CLINICAL DATA: Lung cancer screening. Forty-seven pack-year
history. Current asymptomatic smoker.

EXAM:
CT CHEST WITHOUT CONTRAST LOW-DOSE FOR LUNG CANCER SCREENING
TECHNIQUE: Multidetector CT imaging of the chest was performed following the
standard protocol without IV contrast.

[ct lung segmentation data · axial · 0.77mm/px · z∈[-380,-380]mm · 10 of 376 frames shown]
[frame 1/376  mediastinal]
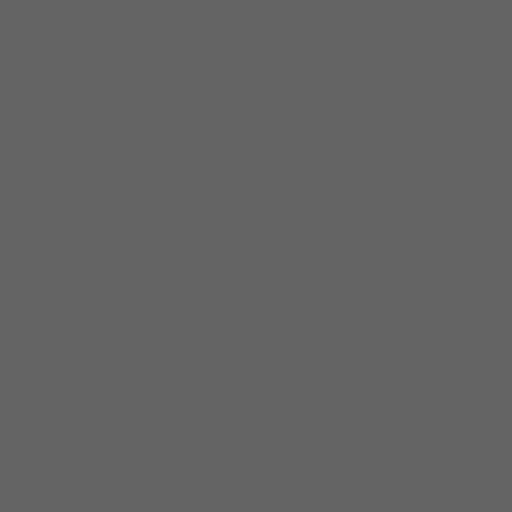
[frame 1/376  lung]
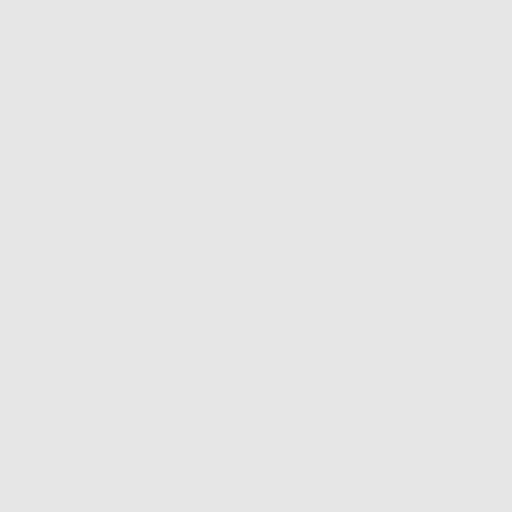
[frame 42/376  lung]
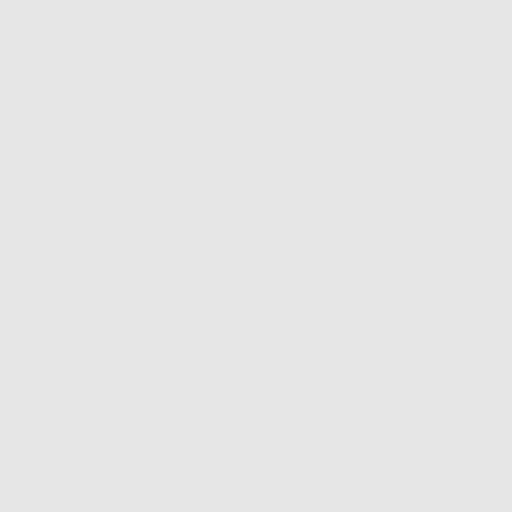
[frame 84/376  lung]
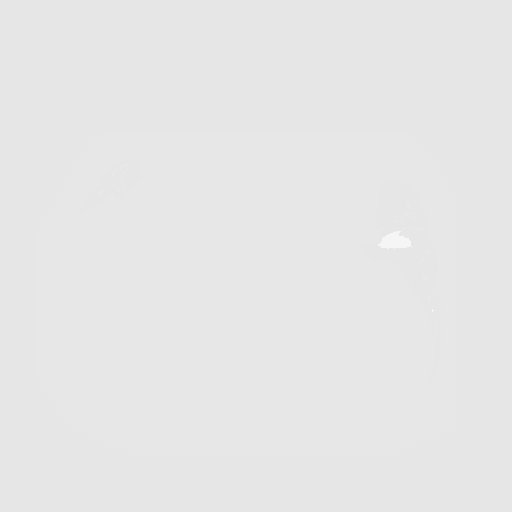
[frame 126/376  lung]
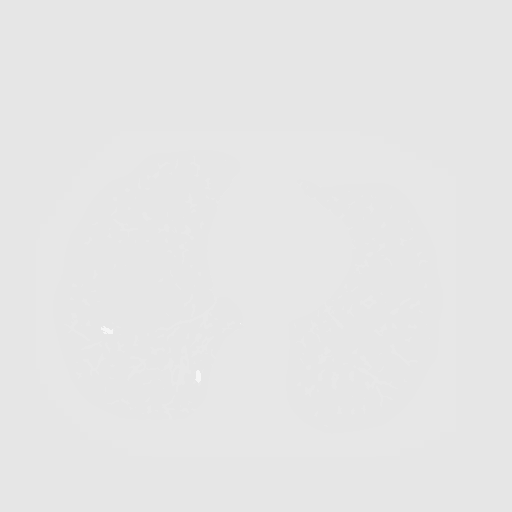
[frame 167/376  mediastinal]
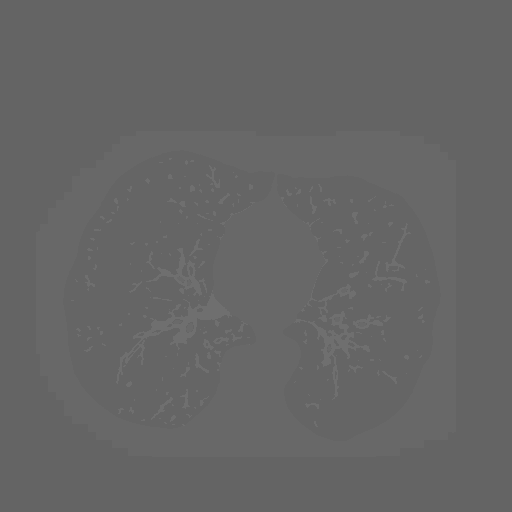
[frame 167/376  lung]
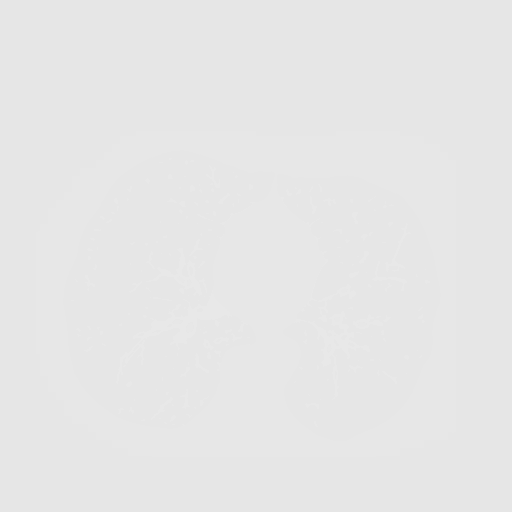
[frame 209/376  lung]
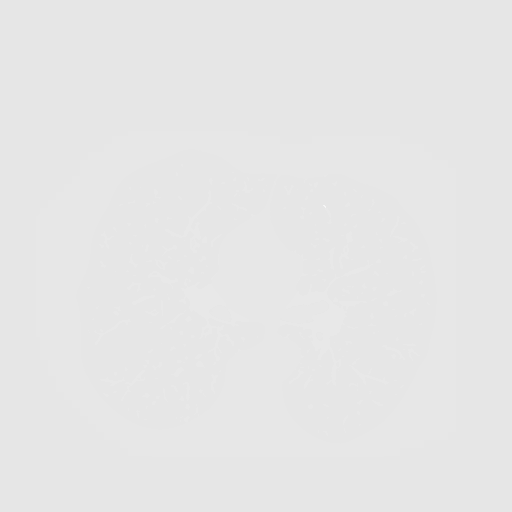
[frame 251/376  lung]
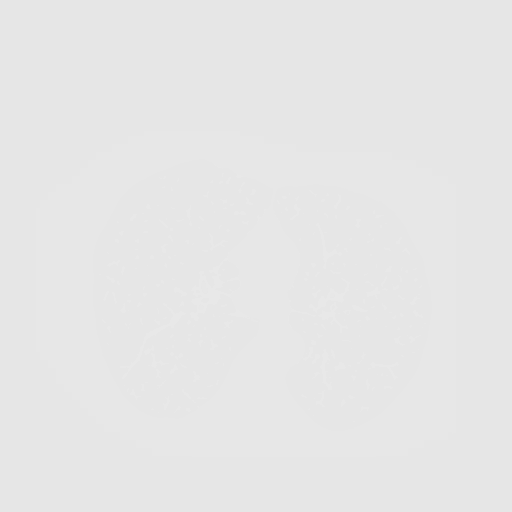
[frame 292/376  lung]
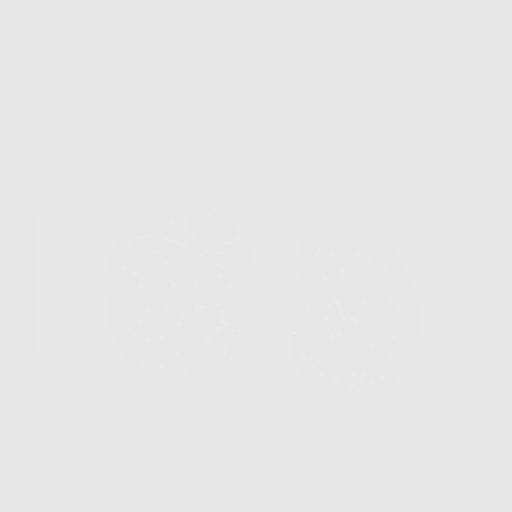
[frame 334/376  mediastinal]
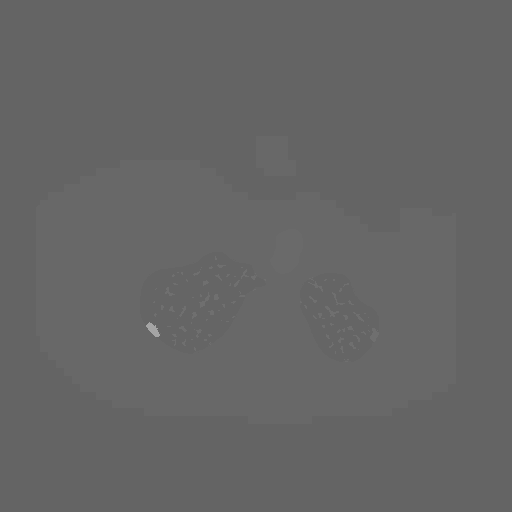
[frame 334/376  lung]
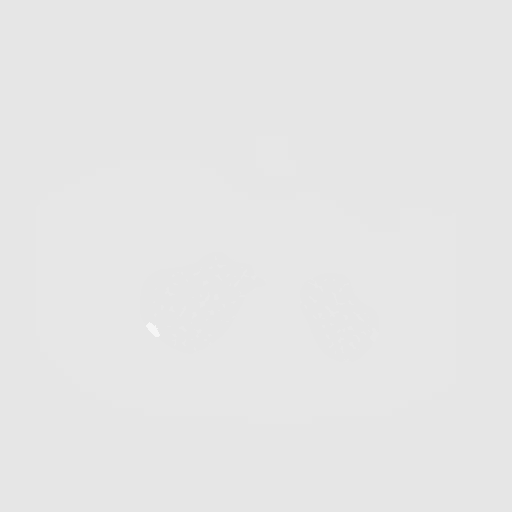
[frame 376/376  lung]
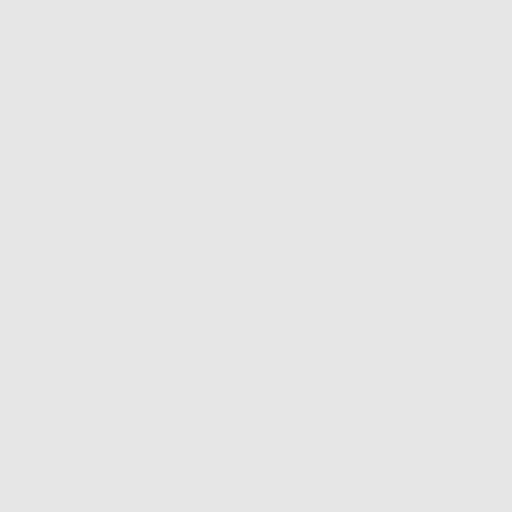

[10 of 40 positions shown; findings below may reference images not displayed]

FINDINGS: Cardiovascular: Aortic atherosclerosis. Coronary artery
calcifications. Normal heart size. No pericardial effusion.

Mediastinum/Nodes: No enlarged mediastinal, hilar, or axillary lymph
nodes. Thyroid gland, trachea, and esophagus demonstrate no
significant findings.

Lungs/Pleura: Mild centrilobular emphysema. Diffuse bronchial wall
thickening identified. Cylindrical bronchiectasis noted within the
right middle lobe, lingula and both lower lobes with scattered areas
of distal airway impaction. No pleural effusion, airspace
consolidation, or atelectasis. Scattered lung nodules are again
noted. Previously noted calcified granulomas are unchanged. There
are 2 new small lung nodules within the right lower lobe which have
a nonspecific appearance. The largest is in the posterolateral right
base with a mean derived diameter of 5.1 mm.

Upper Abdomen: No acute abnormality. Aortic atherosclerosis. Upper
pole left kidney cyst measures 3.3 cm, unchanged from previous exam.

Musculoskeletal: There is anterior and inferior shoulder
dislocation. A large Hill-Sachs deformity is noted involving the
posterior aspect of the humeral head. Although this may reflect a
chronic deformity this is new from [DATE]
IMPRESSION: 1. Lung-RADS 3s, probably benign findings. Short-term follow-up in 6
months is recommended with repeat low-dose chest CT without contrast
(please use the following order, "CT CHEST LCS NODULE FOLLOW-UP W/O
CM").
2. The S modifier above refers to a potentially clinically
significant finding. New from previous exam is a left shoulder
anterior inferior dislocation with Hill-Sachs deformity.
3. Diffuse bronchial wall thickening with emphysema, as above;
imaging findings suggestive of underlying COPD.
4. Coronary artery calcifications noted.

Aortic Atherosclerosis ([6W]-[6W]) and Emphysema ([6W]-[6W]).

## 2020-09-22 ENCOUNTER — Telehealth: Payer: Self-pay | Admitting: Acute Care

## 2020-09-22 NOTE — Telephone Encounter (Signed)
I have called the patient with the results of his scan. Lung RADS 3, nodules that are probably benign findings, short term follow up suggested: includes nodules with a low likelihood of becoming a clinically active cancer. Radiology recommends a 6 month repeat LDCT follow up. I explained that the radiologist feels this could possibly be infectious or inflammatory. Per the patient, he has no fever, and has not been sick. He has an appointment with Dr. Sherene Sires next week.  Dr Sherene Sires please eval need for any antibiotic treatment at that time.  There was an incidental finding of left shoulder anterior inferior dislocation with Hill-Sachs deformity. This is new since last CT. Per the patient he fell in November 2021. He has not had this evaluated. I have asked him to get this evaluated through his PCP for referral to orthopedics. He states that he will seek follow up.  He is in agreement to a 6 month follow up CT to reassess for nodule stability.   Angelique Blonder, please fax results to PCP and order 6 month follow up. Thanks so much.    Nothing further is needed.

## 2020-09-22 NOTE — Progress Notes (Signed)
I have called the patient with the results of his scan. Lung  RADS 3, nodules that are probably benign findings, short term follow up suggested: includes nodules with a low likelihood of becoming a clinically active cancer. Radiology recommends a 6 month repeat LDCT follow up. I explained that the radiologist feels this could possibly be infectious or inflammatory. Per the patient, he has no fever, and has not been sick. He has an appointment with Dr. Sherene Sires next week.  Dr Sherene Sires please eval need for any antibiotic treatment at that time.  There was an incidental finding of left shoulder anterior inferior dislocation with Hill-Sachs deformity. This is new since last CT. Per the patient he fell in November 2021. He has not had this evaluated. I have asked him to get this evaluated through his PCP for referral to orthopedics. He states that he will seek follow up.  He is in agreement to a 6 month follow up CT to reassess for nodule stability.   Angelique Blonder, please fax results to PCP and order 6 month  follow up. Thanks so much.

## 2020-09-22 NOTE — Telephone Encounter (Signed)
I have spoken with Dr. Bradly Chris and with the patient. Please see result note dated 09/22/2020.

## 2020-09-26 ENCOUNTER — Other Ambulatory Visit: Payer: Self-pay | Admitting: *Deleted

## 2020-09-26 DIAGNOSIS — F17210 Nicotine dependence, cigarettes, uncomplicated: Secondary | ICD-10-CM

## 2020-09-26 DIAGNOSIS — Z87891 Personal history of nicotine dependence: Secondary | ICD-10-CM

## 2020-09-28 ENCOUNTER — Ambulatory Visit (INDEPENDENT_AMBULATORY_CARE_PROVIDER_SITE_OTHER): Payer: Medicare Other | Admitting: Internal Medicine

## 2020-09-28 ENCOUNTER — Other Ambulatory Visit: Payer: Self-pay

## 2020-09-28 ENCOUNTER — Encounter: Payer: Self-pay | Admitting: Internal Medicine

## 2020-09-28 DIAGNOSIS — J449 Chronic obstructive pulmonary disease, unspecified: Secondary | ICD-10-CM | POA: Diagnosis not present

## 2020-09-28 DIAGNOSIS — J441 Chronic obstructive pulmonary disease with (acute) exacerbation: Secondary | ICD-10-CM | POA: Diagnosis not present

## 2020-09-28 DIAGNOSIS — F17210 Nicotine dependence, cigarettes, uncomplicated: Secondary | ICD-10-CM

## 2020-09-28 MED ORDER — TRELEGY ELLIPTA 100-62.5-25 MCG/INH IN AEPB: INHALATION_SPRAY | RESPIRATORY_TRACT | 3 refills | Status: DC

## 2020-09-28 MED ORDER — BREZTRI AEROSPHERE 160-9-4.8 MCG/ACT IN AERO: 2.0000 | INHALATION_SPRAY | Freq: Two times a day (BID) | RESPIRATORY_TRACT | 0 refills | Status: DC

## 2020-09-28 MED ORDER — BREZTRI AEROSPHERE 160-9-4.8 MCG/ACT IN AERO: 2.0000 | INHALATION_SPRAY | Freq: Two times a day (BID) | RESPIRATORY_TRACT | 11 refills | Status: DC

## 2020-09-28 MED ORDER — PREDNISONE 10 MG PO TABS: ORAL_TABLET | ORAL | 0 refills | Status: DC

## 2020-09-28 MED ORDER — ALBUTEROL SULFATE HFA 108 (90 BASE) MCG/ACT IN AERS: INHALATION_SPRAY | RESPIRATORY_TRACT | 3 refills | Status: DC

## 2020-09-28 MED ORDER — AZITHROMYCIN 250 MG PO TABS: ORAL_TABLET | ORAL | 0 refills | Status: DC

## 2020-09-28 NOTE — Patient Instructions (Addendum)
The key is to stop smoking completely before smoking completely stops you!  zpak (hold colchicine while on zpak)   Prednisone 10 mg take  4 each am x 2 days,   2 each am x 2 days,  1 each am x 2 days and stop   Follow advice by Kandice Robinsons NP   New PLAN A  = try off trelegy and on Breztri Take 2 puffs first thing in am and then another 2 puffs about 12 hours later.   Work on inhaler technique:  relax and gently blow all the way out then take a nice smooth deep breath back in, triggering the inhaler at same time you start breathing in.  Hold for up to 5 seconds if you can. Blow out thru nose. Rinse and gargle with water when done     Please schedule a follow up visit in  6 months with PFTs on return.

## 2020-09-28 NOTE — Progress Notes (Signed)
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    History of Present Illness  04/03/2017 1st [facility] office visit/ Hennessey Cantrell   Chief Complaint  Patient presents with  . Pulmonary Consult    Referred by Dr. Bobby Rumpf. Pt states dxed with COPD approx 3 yrs ago. He c/o increased DOE for the past 3 months. He gets SOB when he lifts things and when he gets in a hurry. He states he has also had trouble keeping up with yard work. He is also coughing more- prod with yellow to clear sputum.  Cough states cough sometimes wakes him up. He coughs until he is dizzy and feels faint.   was doing some better on  dulera /saba worse started back up one day prior to OV  And  Some better Am of ov dulera 100 x 2 puffs, no saba  Bad am cough x sev tbsp clear mucus am of ov  Doe= MMRC2 = can't walk a nl pace on a flat grade s sob but does fine slow and flat eg walking at [employer] ok  Sleeping poorly due to restless, not cough or sob  Prednisone really helps transiently  rec Plan A = Automatic =  dulera 100 Take 2 puffs first thing in am chase with the spiriva x 2 pffs  and then another 2 puffs of just the dulera about 12 hours later  Plan B = Backup Only use your albuterol as a rescue medication  The key is to stop smoking completely before smoking completely stops you! For cough > mucinex dm up to 1200 mg every 12 hours as needed   Please schedule a follow up office visit in 4 weeks, sooner if needed  with all medications /inhalers/ solutions in hand so we can verify  exactly what you are taking. This includes all medications from all doctors and over the counters    03/30/2018  Acute extended  ov/Anthony Mayo re: aecopd  X 3 months/maint on dulera 100/spiriva smi/ last worked Mar 23 2018  Chief Complaint  Patient presents with  . Acute Visit    Pt c/o increased SOB for the past 3 months. He is coughing up dark yellow to green sputum.  He also c/o wheezing and chest tightness.  He is using his ventolin about 4 x per day. He was recently prescribed nebs but not started on yet.   Dyspnea:  MMRC3 = can't walk 100 yards even at a slow pace at a flat grade s stopping due to sob   Cough:  Esp in am prod sev tbsp yellow mucus assoc with nasal congestion  Sleeping: on side . Bed flat / 2 pillows  SABA use: 4 puffs today last around 5 h prior to OV  / still very confused with how to use meds  rec Plan A = Automatic = Dulera 100 (200 when this one is empty)  Take 2 puffs first thing in am chase with the spiriva  x 2 pffs  and then another 2 puffs of just the dulera about 12 hours later  Prednisone 10 mg Take 4 for three days 3 for three days 2 for three days 1 for three days and stop  Augmentin 875 mg take one pill twice daily  X 10 days - take at breakfast and supper with large glass of water.  It would help reduce the usual side effects (diarrhea and yeast infections) if you ate cultured yogurt at lunch.  Plan B = Backup Only use your albuterol inhaler as a rescue medication  Plan C = Crisis - only use your albuterol nebulizer if you first try Plan B and it fails to help > ok to use the nebulizer up to every 4 hours but if start needing it regularly call for immediate appointment For cough mucinex dm up to 1200 mg every 12 hours as needed  The key is to stop smoking completely before smoking completely stops you!  Out of work until 04/06/18           02/04/2019  f/u ov/Anthony Mayo re:  Copd gold II/ still smoking/  dulera 200/ spiriva  Chief Complaint  Patient presents with   . Follow-up    Breathing is slightly worse since the last visit. Still smoking about 1/2 ppd. He is using his albuterol inhaler 3 x per day on average.   Dyspnea: still = MMRC2 = can't walk a nl pace on a flat grade s sob but does fine slow and flat   Cough: darker, esp in am x sev weeks Sleeping: lie flat 2pillows  SABA use: as above, also neb some rec Plan A = Automatic = Always=    dulera 200 2 puffs in / spiriva 2 puffs then dulera 12 hours later  Plan B = Backup (to supplement plan A, not to replace it) Only use your albuterol inhaler as a rescue medication Plan C = Crisis (instead of Plan B but only if Plan B stops working) - only use your albuterol nebulizer if you first try Plan B  Plan D = Deltasone  Prednisone 10 mg take 2 each until better then 1 daily x 5 days and stop  Plan E = ER - go to ER or call 911 if all else fails   zpak The key is to stop smoking completely before smoking completely stops you!   09/13/2019  f/u ov/Anthony Mayo re: GOLD III copd/ spiriva and dulera 200  Chief Complaint  Patient presents with  . Follow-up    Breathing is about the same "maybe just a little worse"- he has been using his Dulera 3 x per day.   Dyspnea:  MMRC2 = can't walk a nl pace on a flat grade s sob but does fine slow and flat   Cough: white in am Sleeping: ok / 2 pillows bed flat SABA use: not much never neb  02: none  rec Plan A = Automatic = Always=    Dulera 200 2 puffs in / spiriva 2 puffs then dulera 12 hours later  Error:  trelegy one click each am = new Plan A Work on inhaler technique:  Plan B = Backup (to supplement plan A, not to replace it) Only use your albuterol inhaler as a rescue medication  Plan C = Crisis (instead of Plan B but only if Plan B stops working) - only use your albuterol nebulizer if you first try Plan B and it fails to help > ok to  use the nebulizer up to every 4 hours but if start needing it regularly call for immediate appointment Plan D = Deltasone   Prednisone 10 mg take 2 each until better then 1 daily x 5 days and stop  Plan E = ER - go to ER or call 911 if all else fails   The key is to stop smoking completely before smoking completely stops you! Please schedule a follow up visit in 3 months but call sooner if needed  with all medications /inhalers/ solutions in hand so we can verify exactly what you are taking. This includes all medications from all doctors and over the counters> did not do     09/28/2020  f/u ov/Anthony Mayo re:  GOLD II still smoking / worse x 6 mon on  trelegy maint Chief Complaint  Patient presents with  . Follow-up    Review CT Chest 09/22/20. Increased cough x 6 months- sputum green recently. He is using albuterol inhaler2 x per day on average and has not used neb recently.   Dyspnea:  MMRC2 = can't walk a nl pace on a flat grade s sob but does fine slow and flat /  Does now note subjective wheeze with activity  Cough: worse in am/ green  Sleeping: flat bed/ 2 pillows  SABA use: as above  02: not  Covid status:   vax x 3   No obvious day to day or daytime variability or assoc  mucus plugs or hemoptysis or cp or chest tightness,  or overt sinus or hb symptoms.   Sleeping as above without nocturnal exacerbation  of respiratory  c/o's or need for noct saba. Also denies any obvious fluctuation of symptoms with weather or environmental changes or other aggravating or alleviating factors except as outlined above   No unusual exposure hx or h/o childhood pna/ asthma or knowledge of premature birth.  Current Allergies, Complete Past Medical History, Past Surgical History, Family History, and Social History were reviewed in [employer] record.  ROS  The following are not active complaints unless bolded Hoarseness, sore throat, dysphagia, dental problems, itching, sneezing,  nasal congestion or discharge of excess mucus or purulent secretions, ear ache,   fever, chills, sweats, unintended wt loss or  wt gain, classically pleuritic or exertional cp,  orthopnea pnd or arm/hand swelling  or leg swelling, presyncope, palpitations, abdominal pain, anorexia, nausea, vomiting, diarrhea  or change in bowel habits or change in bladder habits, change in stools or change in urine, dysuria, hematuria,  rash, arthralgias, visual complaints, headache, numbness, weakness or ataxia or problems with walking or coordination,  change in mood or  memory.        Current Meds  Medication Sig  . albuterol (PROVENTIL) (2.5 MG/3ML) 0.083% nebulizer solution Take 3 mLs (2.5 mg total) by nebulization every 4 (four) hours as needed for wheezing or shortness of breath.  Marland Kitchen albuterol (VENTOLIN HFA) 108 (90 Base) MCG/ACT inhaler TAKE 2 PUFFS BY MOUTH EVERY 6 HOURS AS NEEDED  . allopurinol (ZYLOPRIM) 100 MG tablet TAKE 1 TABLET(100 MG) BY MOUTH DAILY  . buPROPion (WELLBUTRIN XL) 150 MG 24 hr tablet TAKE 1 TABLET(150 MG) BY MOUTH DAILY FOR 7 DAYS THEN TAKE 2 TABLETS(300 MG) BY MOUTH DAILY  . colchicine 0.6 MG tablet Take 1.2 mg now and 0.6 mg one hour later. Take 0.6 mg daily after that x 5 days.  . Melatonin Gummies 2.5 MG CHEW Chew 5 mg by mouth at bedtime.  Marland Kitchen  nicotine (NICODERM CQ - DOSED IN MG/24 HOURS) 21 mg/24hr patch Place 1 patch (21 mg total) onto the skin daily.  . nicotine polacrilex (GOODSENSE NICOTINE) 4 MG lozenge Take 1 lozenge (4 mg total) by mouth as needed for smoking cessation. (Use up to 9x per day)  . TRELEGY ELLIPTA 100-62.5-25 MCG/INH AEPB INHALE 1 PUFF INTO THE LUNGS EVERY DAY                        Objective:   Physical Exam      09/28/2020      173 02/04/2019       169  05/05/2018     167 03/30/2018     160   04/03/17 167 lb 6.4 oz (75.9 kg)  04/01/17 161 lb (73 kg)  04/18/14 179 lb 12.8 oz (81.6 kg)    Vital signs reviewed  09/28/2020  - Note at rest 02 sats  94% on RA   General appearance:    amb wm nad    HEENT : pt wearing mask not removed for exam due to covid -19 concerns.     NECK :  without JVD/Nodes/TM/ nl carotid upstrokes bilaterally   LUNGS: no acc muscle use,  Mod barrel  contour chest wall with bilateral  Pan exp rhonchi  and  without cough on insp or exp maneuvers and mod  Hyperresonant  to  percussion bilaterally     CV:  RRR  no s3 or murmur or increase in P2, and no edema   ABD:  soft and nontender with pos mid insp Hoover's  in the supine position. No bruits or organomegaly appreciated, bowel sounds nl  MS:     ext warm without deformities, calf tenderness, cyanosis or clubbing No obvious joint restrictions   SKIN: warm and dry without lesions    NEURO:  alert, approp, nl sensorium with  no motor or cerebellar deficits apparent.            I personally reviewed images and agree with radiology impression as follows:   Chest CT LDSCT:  09/21/20  1. Lung-RADS 3s, probably benign findings. Short-term follow-up in 6 months is recommended with repeat low-dose chest CT without contrast (please use the following order, "CT CHEST LCS NODULE FOLLOW-UP W/O CM"). 2. The S modifier above refers to a potentially clinically significant finding. New from previous exam is a left shoulder anterior inferior dislocation with Hill-Sachs deformity. 3. Diffuse bronchial wall thickening with emphysema, as above; imaging findings suggestive of underlying COPD.                   Assessment:

## 2020-09-29 ENCOUNTER — Encounter: Payer: Self-pay | Admitting: Internal Medicine

## 2020-09-29 NOTE — Assessment & Plan Note (Signed)
Active smoker Spirometry 04/03/2017  FEV1 2.05 (54%)  Ratio 52 with atypical curvature p am dulera 100 x2 - 04/03/2017    add spiriva   - Alpha one AT  Screening 04/03/17 :   SS   Level 175    - PFT's  05/05/2018  FEV1 2.21 (58 % ) ratio 51   p 12  % improvement from saba p dulera 200/spiriva prior to study with DLCO  67  % corrects to 68 % for alv volume   02/04/2019  After extensive coaching inhaler device,  effectiveness =   90% 02/04/2019 Add pred as Plan D = 20 mg daily until better then 10 mg daily x 5 days and off - 09/28/2020  After extensive coaching inhaler device,  effectiveness =  90%  >>  trial of Breztri  And approp saba   Active bronchitis/ flare >>>  rx zpak/ pred and  Group D in terms of symptom/risk and laba/lama/ICS  therefore appropriate rx at this point >>>  breztri and sab  I spent extra time with pt today reviewing appropriate use of albuterol for prn use on exertion with the following points: 1) saba is for relief of sob that does not improve by walking a slower pace or resting but rather if the pt does not improve after trying this first. 2) If the pt is convinced, as many are, that saba helps recover from activity faster then it's easy to tell if this is the case by re-challenging : ie stop, take the inhaler, then p 5 minutes try the exact same activity (intensity of workload) that just caused the symptoms and see if they are substantially diminished or not after saba 3) if there is an activity that reproducibly causes the symptoms, try the saba 15 min before the activity on alternate days   If in fact the saba really does help, then fine to continue to use it prn but advised may need to look closer at the maintenance regimen being used to achieve better control of airways disease with exertion.

## 2020-09-29 NOTE — Assessment & Plan Note (Signed)
Counseled re importance of smoking cessation but did not meet time criteria for separate billing            Each maintenance medication was reviewed in detail including emphasizing most importantly the difference between maintenance and prns and under what circumstances the prns are to be triggered using an action plan format where appropriate.  Total time for H and P, chart review, counseling, reviewing hfa device(s) and generating customized AVS unique to this office visit / same day charting  > 30 min       

## 2020-10-25 ENCOUNTER — Telehealth: Payer: Self-pay | Admitting: Emergency Medicine

## 2020-10-25 DIAGNOSIS — M109 Gout, unspecified: Secondary | ICD-10-CM

## 2020-10-26 ENCOUNTER — Other Ambulatory Visit: Payer: Self-pay | Admitting: Emergency Medicine

## 2020-10-26 DIAGNOSIS — M109 Gout, unspecified: Secondary | ICD-10-CM

## 2020-10-26 NOTE — Telephone Encounter (Signed)
Patient called and said that the pharmacy told him that the medication refill request for colchicine 0.6 MG tablet     and  allopurinol (ZYLOPRIM) 100 MG tablet was denied. Patient was wondering if it could be resent. Please advise

## 2020-11-01 NOTE — Telephone Encounter (Signed)
Spoke to patient Allopurinol and colchicine has been picked up.

## 2020-11-20 ENCOUNTER — Other Ambulatory Visit: Payer: Self-pay | Admitting: Emergency Medicine

## 2020-11-20 DIAGNOSIS — M109 Gout, unspecified: Secondary | ICD-10-CM

## 2021-04-10 ENCOUNTER — Encounter: Payer: Self-pay | Admitting: Acute Care

## 2021-04-25 ENCOUNTER — Ambulatory Visit: Payer: Medicare Other | Admitting: Internal Medicine

## 2021-05-17 ENCOUNTER — Ambulatory Visit
Admission: RE | Admit: 2021-05-17 | Discharge: 2021-05-17 | Disposition: A | Discharge status: Home / Self Care | Payer: Medicare Other | Source: Ambulatory Visit | Attending: Acute Care | Admitting: Acute Care

## 2021-05-17 ENCOUNTER — Other Ambulatory Visit: Payer: Self-pay

## 2021-05-17 DIAGNOSIS — J432 Centrilobular emphysema: Secondary | ICD-10-CM | POA: Diagnosis not present

## 2021-05-17 DIAGNOSIS — M47814 Spondylosis without myelopathy or radiculopathy, thoracic region: Secondary | ICD-10-CM | POA: Diagnosis not present

## 2021-05-17 DIAGNOSIS — I2510 Atherosclerotic heart disease of native coronary artery without angina pectoris: Secondary | ICD-10-CM | POA: Diagnosis not present

## 2021-05-17 DIAGNOSIS — Z87891 Personal history of nicotine dependence: Secondary | ICD-10-CM

## 2021-05-17 DIAGNOSIS — F17210 Nicotine dependence, cigarettes, uncomplicated: Secondary | ICD-10-CM

## 2021-05-17 DIAGNOSIS — I700 Atherosclerosis of aorta: Secondary | ICD-10-CM | POA: Diagnosis not present

## 2021-05-18 ENCOUNTER — Other Ambulatory Visit: Payer: Self-pay | Admitting: Acute Care

## 2021-05-18 DIAGNOSIS — Z87891 Personal history of nicotine dependence: Secondary | ICD-10-CM

## 2021-05-18 DIAGNOSIS — F17210 Nicotine dependence, cigarettes, uncomplicated: Secondary | ICD-10-CM

## 2021-05-19 ENCOUNTER — Other Ambulatory Visit: Payer: Self-pay | Admitting: Emergency Medicine

## 2021-05-19 DIAGNOSIS — M109 Gout, unspecified: Secondary | ICD-10-CM

## 2021-05-28 DIAGNOSIS — H2513 Age-related nuclear cataract, bilateral: Secondary | ICD-10-CM | POA: Diagnosis not present

## 2021-05-28 DIAGNOSIS — H40033 Anatomical narrow angle, bilateral: Secondary | ICD-10-CM | POA: Diagnosis not present

## 2021-06-01 ENCOUNTER — Other Ambulatory Visit: Payer: Self-pay | Admitting: Pulmonary Disease

## 2021-06-01 ENCOUNTER — Telehealth: Payer: Self-pay | Admitting: Emergency Medicine

## 2021-06-01 ENCOUNTER — Telehealth: Payer: Self-pay | Admitting: Internal Medicine

## 2021-06-01 ENCOUNTER — Other Ambulatory Visit (HOSPITAL_COMMUNITY): Payer: Self-pay

## 2021-06-01 DIAGNOSIS — M109 Gout, unspecified: Secondary | ICD-10-CM

## 2021-06-01 MED ORDER — COLCHICINE 0.6 MG PO TABS: ORAL_TABLET | ORAL | 1 refills | Status: AC

## 2021-06-01 MED ORDER — TRELEGY ELLIPTA 100-62.5-25 MCG/ACT IN AEPB: 1.0000 | INHALATION_SPRAY | Freq: Every day | RESPIRATORY_TRACT | 5 refills | Status: DC

## 2021-06-01 NOTE — Telephone Encounter (Signed)
Refilled medication

## 2021-06-01 NOTE — Telephone Encounter (Signed)
We can send in RX Trelegy as that is what he has been on before. Please take breztri off list.  CC: Dr. Sherene Sires

## 2021-06-01 NOTE — Telephone Encounter (Signed)
Rx for Trelegy has been sent to pharmacy for pt and have discontinued Breztri off of pt's med list. Called and spoke with pt's spouse Felicity Coyer letting her know this had been done and she verbalized understanding. Nothing further needed.

## 2021-06-01 NOTE — Telephone Encounter (Signed)
Called and spoke with both pt and spouse about pt's Trelegy Rx. They stated that they have been trying to get Rx worked out with [employer] but states that until they receive the Rx from [employer], they need to have Rx sent to local pharmacy as pt only has 4 doses left on his current Trelegy inhaler and does not want to run out of it.  Looked at pt's med list and see Breztri on there and not Trelegy and also per last OV with MW, MW stated for pt to continue on Breztri, but both pt and spouse stated that the Trelegy works much better for pt and due to this, he wants to go back on Trelegy and off of the [employer].  With MW being out of the office today, sending to provider of the day. Beth, please advise if you are okay with [place] sending Rx for Trelegy to pt's pharmacy. Wants it to be sent to [facility] [address] due to his normal [facility] being out of Trelegy.

## 2021-06-01 NOTE — Telephone Encounter (Signed)
1.Medication Requested:colchicine 0.6 MG tablet  2. Pharmacy (Name, Street, [place]): [facility] - [place], [place] - [address]  [facility]  Patient was last seen 05-11-20  Patient's next ov is 06-11-2021

## 2021-06-01 NOTE — Telephone Encounter (Signed)
Medication refilled

## 2021-06-04 ENCOUNTER — Other Ambulatory Visit (HOSPITAL_COMMUNITY): Payer: Self-pay

## 2021-06-07 ENCOUNTER — Other Ambulatory Visit: Payer: Self-pay | Admitting: *Deleted

## 2021-06-07 DIAGNOSIS — J441 Chronic obstructive pulmonary disease with (acute) exacerbation: Secondary | ICD-10-CM

## 2021-06-07 MED ORDER — ALBUTEROL SULFATE HFA 108 (90 BASE) MCG/ACT IN AERS: INHALATION_SPRAY | RESPIRATORY_TRACT | 3 refills | Status: AC

## 2021-06-07 MED ORDER — TRELEGY ELLIPTA 100-62.5-25 MCG/ACT IN AEPB: 1.0000 | INHALATION_SPRAY | Freq: Every day | RESPIRATORY_TRACT | 3 refills | Status: AC

## 2021-06-11 ENCOUNTER — Other Ambulatory Visit: Payer: Self-pay

## 2021-06-11 ENCOUNTER — Ambulatory Visit (INDEPENDENT_AMBULATORY_CARE_PROVIDER_SITE_OTHER): Payer: Medicare Other | Admitting: Emergency Medicine

## 2021-06-11 ENCOUNTER — Encounter: Payer: Self-pay | Admitting: Emergency Medicine

## 2021-06-11 VITALS — BP 165/82 | HR 91 | Ht 72.0 in | Wt 173.0 lb

## 2021-06-11 DIAGNOSIS — M109 Gout, unspecified: Secondary | ICD-10-CM

## 2021-06-11 DIAGNOSIS — I10 Essential (primary) hypertension: Secondary | ICD-10-CM | POA: Diagnosis not present

## 2021-06-11 DIAGNOSIS — J449 Chronic obstructive pulmonary disease, unspecified: Secondary | ICD-10-CM

## 2021-06-11 DIAGNOSIS — I2510 Atherosclerotic heart disease of native coronary artery without angina pectoris: Secondary | ICD-10-CM

## 2021-06-11 DIAGNOSIS — M24412 Recurrent dislocation, left shoulder: Secondary | ICD-10-CM | POA: Insufficient documentation

## 2021-06-11 DIAGNOSIS — I700 Atherosclerosis of aorta: Secondary | ICD-10-CM | POA: Diagnosis not present

## 2021-06-11 DIAGNOSIS — I2583 Coronary atherosclerosis due to lipid rich plaque: Secondary | ICD-10-CM

## 2021-06-11 LAB — CBC WITH DIFFERENTIAL/PLATELET
Basophils Absolute: 0.1 10*3/uL (ref 0.0–0.1)
Basophils Relative: 0.9 % (ref 0.0–3.0)
Eosinophils Absolute: 0.2 10*3/uL (ref 0.0–0.7)
Eosinophils Relative: 1.8 % (ref 0.0–5.0)
HCT: 45 % (ref 39.0–52.0)
Hemoglobin: 14.9 g/dL (ref 13.0–17.0)
Lymphocytes Relative: 21.7 % (ref 12.0–46.0)
Lymphs Abs: 2.2 10*3/uL (ref 0.7–4.0)
MCHC: 33.1 g/dL (ref 30.0–36.0)
MCV: 95.1 fl (ref 78.0–100.0)
Monocytes Absolute: 0.8 10*3/uL (ref 0.1–1.0)
Monocytes Relative: 7.6 % (ref 3.0–12.0)
Neutro Abs: 6.8 10*3/uL (ref 1.4–7.7)
Neutrophils Relative %: 68 % (ref 43.0–77.0)
Platelets: 348 10*3/uL (ref 150.0–400.0)
RBC: 4.74 Mil/uL (ref 4.22–5.81)
RDW: 12.8 % (ref 11.5–15.5)
WBC: 10 10*3/uL (ref 4.0–10.5)

## 2021-06-11 LAB — LIPID PANEL
Cholesterol: 180 mg/dL (ref 0–200)
HDL: 46.4 mg/dL (ref >39.00)
LDL Cholesterol: 109 mg/dL — ABNORMAL HIGH (ref 0–99)
NonHDL: 133.9
Total CHOL/HDL Ratio: 4
Triglycerides: 126 mg/dL (ref 0.0–149.0)
VLDL: 25.2 mg/dL (ref 0.0–40.0)

## 2021-06-11 LAB — COMPREHENSIVE METABOLIC PANEL
ALT: 8 U/L (ref 0–53)
AST: 13 U/L (ref 0–37)
Albumin: 3.8 g/dL (ref 3.5–5.2)
Alkaline Phosphatase: 50 U/L (ref 39–117)
BUN: 12 mg/dL (ref 6–23)
CO2: 30 mEq/L (ref 19–32)
Calcium: 9.6 mg/dL (ref 8.4–10.5)
Chloride: 102 mEq/L (ref 96–112)
Creatinine, Ser: 0.87 mg/dL (ref 0.40–1.50)
GFR: 90.36 mL/min (ref >60.00)
Glucose, Bld: 100 mg/dL — ABNORMAL HIGH (ref 70–99)
Potassium: 4.1 mEq/L (ref 3.5–5.1)
Sodium: 140 mEq/L (ref 135–145)
Total Bilirubin: 0.4 mg/dL (ref 0.2–1.2)
Total Protein: 7 g/dL (ref 6.0–8.3)

## 2021-06-11 LAB — HEMOGLOBIN A1C: Hgb A1c MFr Bld: 5.7 % (ref 4.6–6.5)

## 2021-06-11 LAB — URIC ACID: Uric Acid, Serum: 8 mg/dL — ABNORMAL HIGH (ref 4.0–7.8)

## 2021-06-11 MED ORDER — ROSUVASTATIN CALCIUM 20 MG PO TABS: 20.0000 mg | ORAL_TABLET | Freq: Every day | ORAL | 3 refills | Status: DC

## 2021-06-11 MED ORDER — AMLODIPINE BESYLATE 5 MG PO TABS: 5.0000 mg | ORAL_TABLET | Freq: Every day | ORAL | 3 refills | Status: DC

## 2021-06-11 MED ORDER — ALLOPURINOL 100 MG PO TABS: 100.0000 mg | ORAL_TABLET | Freq: Every day | ORAL | 3 refills | Status: AC

## 2021-06-11 NOTE — Assessment & Plan Note (Addendum)
Still smoking.  Has some dyspnea on exertion.  On Trelegy. Cancer and cardiovascular risks associated with smoking discussed. Smoking cessation advice given.

## 2021-06-11 NOTE — Patient Instructions (Signed)
Coronary Artery Disease, Male Coronary artery disease (CAD) is a condition in which the arteries that lead to the heart (coronary arteries) become narrow or blocked. The narrowing or blockage can lead to decreased blood flow to the heart. Prolonged reduced blood flow can cause a heart attack (myocardial infarction or MI). This condition may also be called coronary heart disease. Because CAD is the leading cause of death in men, it is important to understand what causes this condition and how it is treated. What are the causes? CAD is most often caused by atherosclerosis. This is the buildup of fat and cholesterol (plaque) on the inside of the arteries. Over time, the plaque may narrow or block the artery, reducing blood flow to the heart. Plaque can also become weak and break off within a coronary artery and cause a sudden blockage. Other less common causes of CAD include: A blood clot or a piece of a blood clot or other substance that blocks the flow of blood in a coronary artery (embolism). A tearing of the artery (spontaneous coronary artery dissection). An enlargement of an artery (aneurysm). Inflammation (vasculitis) in the artery wall. What increases the risk? The following factors may make you more likely to develop this condition: Age. Men over age 45 are at a greater risk of CAD. Family history of CAD. Gender. Men often develop CAD earlier in life than women. High blood pressure (hypertension). Diabetes. High cholesterol levels. Tobacco use. Excessive alcohol use. Lack of exercise. A diet high in saturated and trans fats, such as fried food and processed meat. Other possible risk factors include: High stress levels. Depression. Obesity. Sleep apnea. What are the signs or symptoms? Many people do not have any symptoms during the early stages of CAD. As the condition progresses, symptoms may include: Chest pain (angina). The pain can: Feel like crushing or squeezing, or like a  tightness, pressure, fullness, or heaviness in the chest. Last more than a few minutes or can stop and recur. The pain tends to get worse with exercise or stress and to fade with rest. Pain in the arms, neck, jaw, ear, or back. Unexplained heartburn or indigestion. Shortness of breath. Nausea or vomiting. Sudden light-headedness. Sudden cold sweats. Fluttering or fast heartbeat (palpitations). How is this diagnosed? This condition is diagnosed based on: Your family and medical history. A physical exam. Tests, including: A test to check the electrical signals in your heart (electrocardiogram). Exercise stress test. This looks for signs of blockage when the heart is stressed with exercise, such as running on a treadmill. Pharmacologic stress test. This test looks for signs of blockage when the heart is being stressed with a medicine. Blood tests. Coronary angiogram. This is a procedure to look at the coronary arteries to see if there is any blockage. During this test, a dye is injected into your arteries so they appear on an X-ray. Coronary artery CT scan. This CT scan helps detect calcium deposits in your coronary arteries. Calcium deposits are an indicator of CAD. A test that uses sound waves to take a picture of your heart (echocardiogram). Chest X-ray. How is this treated? This condition may be treated by: Healthy lifestyle changes to reduce risk factors. Medicines such as: Antiplatelet medicines and blood-thinning medicines, such as aspirin. These help to prevent blood clots. Nitroglycerin. Blood pressure medicines. Cholesterol-lowering medicine. Coronary angioplasty and stenting. During this procedure, a thin, flexible tube is inserted through a blood vessel and into a blocked artery. A balloon or similar device on   the end of the tube is inflated to open up the artery. In some cases, a small, mesh tube (stent) is inserted into the artery to keep it open. Coronary artery bypass  surgery. During this surgery, veins or arteries from other parts of the body are used to create a bypass around the blockage and allow blood to reach your heart. Follow these instructions at home: Medicines Take over-the-counter and prescription medicines only as told by your health care provider. Do not take the following medicines unless your health care provider approves: NSAIDs, such as ibuprofen, naproxen, or celecoxib. Vitamin supplements that contain vitamin A, vitamin E, or both. Lifestyle Follow an exercise program approved by your health care provider. Aim for 150 minutes of moderate exercise or 75 minutes of vigorous exercise each week. Maintain a healthy weight or lose weight as approved by your health care provider. Learn to manage stress or try to limit your stress. Ask your health care provider for suggestions if you need help. Get screened for depression and seek treatment, if needed. Do not use any products that contain nicotine or tobacco, such as cigarettes, e-cigarettes, and chewing tobacco. If you need help quitting, ask your health care provider. Do not use illegal drugs. Eating and drinking  Follow a heart-healthy diet. A dietitian can help educate you about healthy food options and changes. In general, eat plenty of fruits and vegetables, lean meats, and whole grains. Avoid foods high in: Sugar. Salt (sodium). Saturated fat, such as processed or fatty meat. Trans fat, such as fried foods. Use healthy cooking methods such as roasting, grilling, broiling, baking, poaching, steaming, or stir-frying. Do not drink alcohol if your health care provider tells you not to drink. If you drink alcohol: Limit how much you have to 0-2 drinks per day. Be aware of how much alcohol is in your drink. In the [place] one drink equals one 12 oz bottle of beer (355 mL), one 5 oz glass of wine (148 mL), or one 1 oz glass of hard liquor (44 mL). General instructions Manage any other health  conditions, such as hypertension and diabetes. These conditions affect your heart. Your health care provider may ask you to monitor your blood pressure. Ideally, your blood pressure should be below 130/80. Keep all follow-up visits as told by your health care provider. This is important. Get help right away if: You have pain in your chest, neck, ear, arm, jaw, stomach, or back that: Lasts more than a few minutes. Is recurring. Is not relieved by taking medicine under your tongue (sublingual nitroglycerin). You have profuse sweating without cause. You have unexplained: Heartburn or indigestion. Shortness of breath or difficulty breathing. Fluttering or fast heartbeat (palpitations). Nausea or vomiting. Fatigue. Feelings of nervousness or anxiety. Weakness. Diarrhea. You have sudden light-headedness or dizziness. You faint. You feel like hurting yourself or think about taking your own life. These symptoms may represent a serious problem that is an emergency. Do not wait to see if the symptoms will go away. Get medical help right away. Call your local emergency services (911 in the [place] Do not drive yourself to the hospital. Summary Coronary artery disease (CAD) is a condition in which the arteries that lead to the heart (coronary arteries) become narrow or blocked. The narrowing or blockage can lead to a heart attack. Many people do not have any symptoms during the early stages of CAD. CAD can be treated with lifestyle changes, medicines, surgery, or a combination of these treatments. This information   is not intended to replace advice given to you by your health care provider. Make sure you discuss any questions you have with your health care provider. Document Revised: 01/23/2018 Document Reviewed: 01/13/2018 [employer] Patient Education  2022 [employer]  

## 2021-06-11 NOTE — Assessment & Plan Note (Signed)
Chronic left shoulder pain and limited range of motion.  Needs orthopedic evaluation.  Will need surgery in the future.

## 2021-06-11 NOTE — Assessment & Plan Note (Signed)
Incidental finding on CT scan of chest.  No anginal episodes.  Needs cardiac work-up and cardiology evaluation.  Advised to take 1 baby aspirin daily and start rosuvastatin 20 mg daily.

## 2021-06-11 NOTE — Assessment & Plan Note (Signed)
We will start rosuvastatin 20 mg daily. Needs blood work today.

## 2021-06-11 NOTE — Progress Notes (Signed)
BP Readings from Last 3 Encounters:  06/11/21 (!) 165/82  09/28/20 (!) 142/82  05/11/20 (!) 155/89   Maia Plan 66 y.o.   Chief Complaint  Patient presents with   Gout    Medication management, 90 day [facility].   Shoulder Pain    Left, x 1 yr, pt had a fall causing shoulder pain    HISTORY OF PRESENT ILLNESS: This is a 66 y.o. male here for follow-up of chronic medical problems. #1 COPD mostly emphysema.  Still smoking.  On Trelegy. #2 history of gout.  Needs refill on allopurinol.  Also needs blood work. #3 chronic left shoulder pain with limited range of motion.  Injuries in the past.  Recent CT of chest shows chronic left anterior dislocation with Hill-Sachs deformity #4 hypertension on no medications CT scan of chest done on 05/17/2021 showed the following: IMPRESSION: 1. Lung-RADS 2, benign appearance or behavior. Continue annual screening with low-dose chest CT without contrast in 12 months. 2. Three-vessel coronary atherosclerosis. 3. Chronic anterior left shoulder dislocation with large Hill-Sachs deformity in the left humeral head. 4. Aortic Atherosclerosis (ICD10-I70.0) and Emphysema (ICD10-J43.9).  Shoulder Pain  Pertinent negatives include no fever.    Prior to Admission medications   Medication Sig Start Date End Date Taking? Authorizing Provider  amLODipine (NORVASC) 5 MG tablet Take 1 tablet (5 mg total) by mouth daily. 06/11/21  Yes Sanjana Folz, Ines Bloomer, MD  rosuvastatin (CRESTOR) 20 MG tablet Take 1 tablet (20 mg total) by mouth daily. 06/11/21  Yes [place], Ines Bloomer, MD  albuterol (PROVENTIL) (2.5 MG/3ML) 0.083% nebulizer solution Take 3 mLs (2.5 mg total) by nebulization every 4 (four) hours as needed for wheezing or shortness of breath. 09/13/19   Lauraine Rinne, NP  albuterol (VENTOLIN HFA) 108 (90 Base) MCG/ACT inhaler TAKE 2 PUFFS BY MOUTH EVERY 6 HOURS AS NEEDED 06/07/21   Tanda Rockers, MD  allopurinol (ZYLOPRIM)  100 MG tablet Take 1 tablet (100 mg total) by mouth daily. 06/11/21 09/09/21  Horald Pollen, MD  buPROPion (WELLBUTRIN XL) 150 MG 24 hr tablet TAKE 1 TABLET(150 MG) BY MOUTH DAILY FOR 7 DAYS THEN TAKE 2 TABLETS(300 MG) BY MOUTH DAILY 12/08/19   Tanda Rockers, MD  colchicine 0.6 MG tablet TAKE 2 TABLETS BY MOUTH NOW AND 1 TABLET 1 HOUR LATER, THEN 1 TABLET DAILY FOR 5 DAYS AFTERWARDS 06/01/21   Horald Pollen, MD  Fluticasone-Umeclidin-Vilant (TRELEGY ELLIPTA) 100-62.5-25 MCG/ACT AEPB Inhale 1 puff into the lungs daily. 06/07/21   Tanda Rockers, MD  Melatonin Gummies 2.5 MG CHEW Chew 5 mg by mouth at bedtime.    [provider]  nicotine (NICODERM CQ - DOSED IN MG/24 HOURS) 21 mg/24hr patch Place 1 patch (21 mg total) onto the skin daily. Patient not taking: Reported on 06/11/2021 09/13/19   Lauraine Rinne, NP  nicotine polacrilex (GOODSENSE NICOTINE) 4 MG lozenge Take 1 lozenge (4 mg total) by mouth as needed for smoking cessation. (Use up to 9x per day) 09/13/19   Lauraine Rinne, NP  predniSONE (DELTASONE) 10 MG tablet Take  4 each am x 2 days,   2 each am x 2 days,  1 each am x 2 days and stop Patient not taking: Reported on 06/11/2021 09/28/20   Tanda Rockers, MD    No Known Allergies  Patient Active Problem List   Diagnosis Date Noted   COPD exacerbation ([place]) 12/04/2018   Chronic hepatitis C  without hepatic coma ([place]) 11/08/2017   Recurrent headache 10/24/2017   Depression with anxiety 10/24/2017   Cigarette smoker 04/04/2017   Dyspnea on exertion 04/01/2017   COPD   GOLD II/ still smoking 03/24/2012   Asthma 03/19/2012    Past Medical History:  Diagnosis Date   Asthma    COPD (chronic obstructive pulmonary disease) ([place])    Gout     Past Surgical History:  Procedure Laterality Date   REPLACEMENT TOTAL KNEE  12/11/2010    Social History   Socioeconomic History   Marital status: Married    Spouse name: Not on file   Number of children: 0   Years of  education: Not on file   Highest education level: Not on file  Occupational History   Not on file  Tobacco Use   Smoking status: Every Day    Packs/day: 1.00    Years: 47.00    Pack years: 47.00    Types: Cigarettes   Smokeless tobacco: Current    Types: Snuff  Vaping Use   Vaping Use: Never used  Substance and Sexual Activity   Alcohol use: Yes    Alcohol/week: 24.0 standard drinks    Types: 24 Cans of beer per week   Drug use: No   Sexual activity: Yes    Partners: Female  Other Topics Concern   Not on file  Social History Narrative   Not on file   Social Determinants of Health   Financial Resource Strain: Not on file  Food Insecurity: Not on file  Transportation Needs: Not on file  Physical Activity: Not on file  Stress: Not on file  Social Connections: Not on file  Intimate Partner Violence: Not on file    Family History  Problem Relation Age of Onset   Prostate cancer Father      Review of Systems  Constitutional: Negative.  Negative for chills and fever.  HENT: Negative.  Negative for congestion and sore throat.   Respiratory:  Positive for shortness of breath (Dyspnea on exertion). Negative for cough.   Cardiovascular: Negative.  Negative for chest pain and palpitations.  Gastrointestinal: Negative.  Negative for abdominal pain, diarrhea, nausea and vomiting.  Genitourinary: Negative.  Negative for dysuria and hematuria.  Musculoskeletal:  Positive for joint pain (Left shoulder pain).  Skin: Negative.  Negative for rash.  Neurological: Negative.  Negative for dizziness and headaches.  All other systems reviewed and are negative.  Today's Vitals   06/11/21 1410  BP: (!) 165/82  Pulse: 91  SpO2: 94%  Weight: 173 lb (78.5 kg)  Height: 6' (1.829 m)   Body mass index is 23.46 kg/m.  Physical Exam Vitals reviewed.  Constitutional:      Appearance: Normal appearance.  HENT:     Head: Normocephalic.  Eyes:     Extraocular Movements: Extraocular  movements intact.     Conjunctiva/sclera: Conjunctivae normal.     Pupils: Pupils are equal, round, and reactive to light.  Cardiovascular:     Rate and Rhythm: Normal rate and regular rhythm.     Pulses: Normal pulses.     Heart sounds: Normal heart sounds.  Pulmonary:     Effort: Pulmonary effort is normal.     Breath sounds: No rales.     Comments: Distant breath sounds compatible with emphysema Abdominal:     Palpations: Abdomen is soft.     Tenderness: There is no abdominal tenderness.  Musculoskeletal:     Cervical back: Normal range  of motion and neck supple. No tenderness.     Comments: Left shoulder: Limited range of motion  Lymphadenopathy:     Cervical: No cervical adenopathy.  Skin:    General: Skin is warm and dry.  Neurological:     General: No focal deficit present.     Mental Status: He is alert and oriented to person, place, and time.  Psychiatric:        Mood and Affect: Mood normal.        Behavior: Behavior normal.     ASSESSMENT & PLAN: A total of 51 minutes was spent with the patient and counseling/coordination of care regarding preparing for this visit, review of most recent office visit notes, review of most recent CT scan of the chest done, review of all medications, new diagnosis of hypertension and coronary artery disease, cardiovascular risks associated with hypertension and dyslipidemia, cancer and cardiovascular risk associated with smoking, smoking cessation advice, COPD management, need for cardiology work-up and evaluation, education on nutrition, review of most recent blood work results and need for blood work today, gout and its management, need for orthopedic evaluation of chronic left shoulder dislocation, prognosis, documentation and need for follow-up.  Problem List Items Addressed This Visit       Cardiovascular and Mediastinum   Aortic atherosclerosis (HCC)    We will start rosuvastatin 20 mg daily. Needs blood work today.       Relevant Medications   rosuvastatin (CRESTOR) 20 MG tablet   amLODipine (NORVASC) 5 MG tablet   Other Relevant Orders   Lipid panel   Coronary atherosclerosis due to lipid rich plaque    Incidental finding on CT scan of chest.  No anginal episodes.  Needs cardiac work-up and cardiology evaluation.  Advised to take 1 baby aspirin daily and start rosuvastatin 20 mg daily.      Relevant Medications   rosuvastatin (CRESTOR) 20 MG tablet   amLODipine (NORVASC) 5 MG tablet   Other Relevant Orders   Ambulatory referral to Cardiology   Hemoglobin A1c   Lipid panel   Essential hypertension    Elevated blood pressure readings the past several times BP Readings from Last 3 Encounters:  06/11/21 (!) 165/82  09/28/20 (!) 142/82  05/11/20 (!) 155/89  Will start amlodipine 5 mg daily. Dietary approaches to stop hypertension discussed.       Relevant Medications   rosuvastatin (CRESTOR) 20 MG tablet   amLODipine (NORVASC) 5 MG tablet   Other Relevant Orders   Comprehensive metabolic panel   CBC with Differential/Platelet   Hemoglobin A1c     Respiratory   COPD   GOLD II/ still smoking - Primary    Still smoking.  Has some dyspnea on exertion.  On Trelegy. Cancer and cardiovascular risks associated with smoking discussed. Smoking cessation advice given.        Musculoskeletal and Integument   Chronic dislocation of left shoulder    Chronic left shoulder pain and limited range of motion.  Needs orthopedic evaluation.  Will need surgery in the future.      Other Visit Diagnoses     Acute gouty arthritis       Relevant Medications   allopurinol (ZYLOPRIM) 100 MG tablet   Other Relevant Orders   Uric acid      Patient Instructions  Coronary Artery Disease, Male Coronary artery disease (CAD) is a condition in which the arteries that lead to the heart (coronary arteries) become narrow or blocked. The narrowing or  blockage can lead to decreased blood flow to the heart. Prolonged  reduced blood flow can cause a heart attack (myocardial infarction or MI). This condition may also be called coronary heart disease. Because CAD is the leading cause of death in men, it is important to understand what causes this condition and how it is treated. What are the causes? CAD is most often caused by atherosclerosis. This is the buildup of fat and cholesterol (plaque) on the inside of the arteries. Over time, the plaque may narrow or block the artery, reducing blood flow to the heart. Plaque can also become weak and break off within a coronary artery and cause a sudden blockage. Other less common causes of CAD include: A blood clot or a piece of a blood clot or other substance that blocks the flow of blood in a coronary artery (embolism). A tearing of the artery (spontaneous coronary artery dissection). An enlargement of an artery (aneurysm). Inflammation (vasculitis) in the artery wall. What increases the risk? The following factors may make you more likely to develop this condition: Age. Men over age 46 are at a greater risk of CAD. Family history of CAD. Gender. Men often develop CAD earlier in life than women. High blood pressure (hypertension). Diabetes. High cholesterol levels. Tobacco use. Excessive alcohol use. Lack of exercise. A diet high in saturated and trans fats, such as fried food and processed meat. Other possible risk factors include: High stress levels. Depression. Obesity. Sleep apnea. What are the signs or symptoms? Many people do not have any symptoms during the early stages of CAD. As the condition progresses, symptoms may include: Chest pain (angina). The pain can: Feel like crushing or squeezing, or like a tightness, pressure, fullness, or heaviness in the chest. Last more than a few minutes or can stop and recur. The pain tends to get worse with exercise or stress and to fade with rest. Pain in the arms, neck, jaw, ear, or back. Unexplained heartburn  or indigestion. Shortness of breath. Nausea or vomiting. Sudden light-headedness. Sudden cold sweats. Fluttering or fast heartbeat (palpitations). How is this diagnosed? This condition is diagnosed based on: Your family and medical history. A physical exam. Tests, including: A test to check the electrical signals in your heart (electrocardiogram). Exercise stress test. This looks for signs of blockage when the heart is stressed with exercise, such as running on a treadmill. Pharmacologic stress test. This test looks for signs of blockage when the heart is being stressed with a medicine. Blood tests. Coronary angiogram. This is a procedure to look at the coronary arteries to see if there is any blockage. During this test, a dye is injected into your arteries so they appear on an X-ray. Coronary artery CT scan. This CT scan helps detect calcium deposits in your coronary arteries. Calcium deposits are an indicator of CAD. A test that uses sound waves to take a picture of your heart (echocardiogram). Chest X-ray. How is this treated? This condition may be treated by: Healthy lifestyle changes to reduce risk factors. Medicines such as: Antiplatelet medicines and blood-thinning medicines, such as aspirin. These help to prevent blood clots. Nitroglycerin. Blood pressure medicines. Cholesterol-lowering medicine. Coronary angioplasty and stenting. During this procedure, a thin, flexible tube is inserted through a blood vessel and into a blocked artery. A balloon or similar device on the end of the tube is inflated to open up the artery. In some cases, a small, mesh tube (stent) is inserted into the artery to keep it  open. Coronary artery bypass surgery. During this surgery, veins or arteries from other parts of the body are used to create a bypass around the blockage and allow blood to reach your heart. Follow these instructions at home: Medicines Take over-the-counter and prescription  medicines only as told by your health care provider. Do not take the following medicines unless your health care provider approves: NSAIDs, such as ibuprofen, naproxen, or celecoxib. Vitamin supplements that contain vitamin A, vitamin E, or both. Lifestyle Follow an exercise program approved by your health care provider. Aim for 150 minutes of moderate exercise or 75 minutes of vigorous exercise each week. Maintain a healthy weight or lose weight as approved by your health care provider. Learn to manage stress or try to limit your stress. Ask your health care provider for suggestions if you need help. Get screened for depression and seek treatment, if needed. Do not use any products that contain nicotine or tobacco, such as cigarettes, e-cigarettes, and chewing tobacco. If you need help quitting, ask your health care provider. Do not use illegal drugs. Eating and drinking  Follow a heart-healthy diet. A dietitian can help educate you about healthy food options and changes. In general, eat plenty of fruits and vegetables, lean meats, and whole grains. Avoid foods high in: Sugar. Salt (sodium). Saturated fat, such as processed or fatty meat. Trans fat, such as fried foods. Use healthy cooking methods such as roasting, grilling, broiling, baking, poaching, steaming, or stir-frying. Do not drink alcohol if your health care provider tells you not to drink. If you drink alcohol: Limit how much you have to 0-2 drinks per day. Be aware of how much alcohol is in your drink. In the [place] one drink equals one 12 oz bottle of beer (355 mL), one 5 oz glass of wine (148 mL), or one 1 oz glass of hard liquor (44 mL). General instructions Manage any other health conditions, such as hypertension and diabetes. These conditions affect your heart. Your health care provider may ask you to monitor your blood pressure. Ideally, your blood pressure should be below 130/80. Keep all follow-up visits as told by  your health care provider. This is important. Get help right away if: You have pain in your chest, neck, ear, arm, jaw, stomach, or back that: Lasts more than a few minutes. Is recurring. Is not relieved by taking medicine under your tongue (sublingual nitroglycerin). You have profuse sweating without cause. You have unexplained: Heartburn or indigestion. Shortness of breath or difficulty breathing. Fluttering or fast heartbeat (palpitations). Nausea or vomiting. Fatigue. Feelings of nervousness or anxiety. Weakness. Diarrhea. You have sudden light-headedness or dizziness. You faint. You feel like hurting yourself or think about taking your own life. These symptoms may represent a serious problem that is an emergency. Do not wait to see if the symptoms will go away. Get medical help right away. Call your local emergency services (911 in the [place] Do not drive yourself to the hospital. Summary Coronary artery disease (CAD) is a condition in which the arteries that lead to the heart (coronary arteries) become narrow or blocked. The narrowing or blockage can lead to a heart attack. Many people do not have any symptoms during the early stages of CAD. CAD can be treated with lifestyle changes, medicines, surgery, or a combination of these treatments. This information is not intended to replace advice given to you by your health care provider. Make sure you discuss any questions you have with your health care provider. Document  Revised: 01/23/2018 Document Reviewed: 01/13/2018 Elsevier Patient Education  2022 [place], MD [facility]

## 2021-06-11 NOTE — Assessment & Plan Note (Signed)
Elevated blood pressure readings the past several times BP Readings from Last 3 Encounters:  06/11/21 (!) 165/82  09/28/20 (!) 142/82  05/11/20 (!) 155/89  Will start amlodipine 5 mg daily. Dietary approaches to stop hypertension discussed.

## 2021-06-27 NOTE — Progress Notes (Signed)
Cardiology Office Note:    Date:  06/28/2021   ID:  Anthony Mayo, DOB [birth year], MRN [id number]  PCP:  Anthony Quint, MD   [facility] Providers Cardiologist:  Anthony Constant, MD     Referring MD: Anthony Mayo, *   CC: HTN Consulted for the evaluation of CAC at the behest of Anthony Mayo, Anthony Kempf, MD  History of Present Illness:    Anthony Mayo is a 66 y.o. male with a hx of COPD and Asthma, smoker, CAC and aortic atherosclerosis, HTN who presents for evaluation.   Patient notes that he is feeling not great for the last several months.  Notes fatigue and general malaise.Just retired from his job at a Occupational [employer] and has felt a bit better since.  A bit of anhedonia.  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  Note sure what the difference is between his good and bad days, feels like they are half and half.  Has had no chest pain, chest pressure, chest tightness, chest stinging .  No shortness of breath, but does have some DOE.  No PND or orthopnea.  No weight gain, leg swelling , or abdominal swelling.  No syncope or near syncope . Notes  no palpitations or funny heart beats.     Started rosuvastatin 20 mg PO daily 06/01/21.  Ambulatory BP 145.   Past Medical History:  Diagnosis Date   Asthma    COPD (chronic obstructive pulmonary disease) (HCC)    Gout     Past Surgical History:  Procedure Laterality Date   REPLACEMENT TOTAL KNEE  12/11/2010    Current Medications: Current Meds  Medication Sig   albuterol (PROVENTIL) (2.5 MG/3ML) 0.083% nebulizer solution Take 3 mLs (2.5 mg total) by nebulization every 4 (four) hours as needed for wheezing or shortness of breath.   albuterol (VENTOLIN HFA) 108 (90 Base) MCG/ACT inhaler TAKE 2 PUFFS BY MOUTH EVERY 6 HOURS AS NEEDED   allopurinol (ZYLOPRIM) 100 MG tablet Take 1 tablet (100 mg total) by mouth daily.   amLODipine (NORVASC) 10 MG tablet Take 1 tablet (10 mg total) by mouth daily.   aspirin EC 81 MG  tablet Take 1 tablet (81 mg total) by mouth daily. Swallow whole.   colchicine 0.6 MG tablet TAKE 2 TABLETS BY MOUTH NOW AND 1 TABLET 1 HOUR LATER, THEN 1 TABLET DAILY FOR 5 DAYS AFTERWARDS   Fluticasone-Umeclidin-Vilant (TRELEGY ELLIPTA) 100-62.5-25 MCG/ACT AEPB Inhale 1 puff into the lungs daily.   Melatonin Gummies 2.5 MG CHEW Chew 5 mg by mouth at bedtime.   predniSONE (DELTASONE) 10 MG tablet Take  4 each am x 2 days,   2 each am x 2 days,  1 each am x 2 days and stop   rosuvastatin (CRESTOR) 20 MG tablet Take 1 tablet (20 mg total) by mouth daily.   [DISCONTINUED] amLODipine (NORVASC) 5 MG tablet Take 1 tablet (5 mg total) by mouth daily.     Allergies:   Patient has no known allergies.   Social History   Socioeconomic History   Marital status: Married    Spouse name: Not on file   Number of children: 0   Years of education: Not on file   Highest education level: Not on file  Occupational History   Not on file  Tobacco Use   Smoking status: Every Day    Packs/day: 1.00    Years: 47.00    Pack years: 47.00    Types: Cigarettes  Smokeless tobacco: Current    Types: Snuff  Vaping Use   Vaping Use: Never used  Substance and Sexual Activity   Alcohol use: Yes    Alcohol/week: 24.0 standard drinks    Types: 24 Cans of beer per week   Drug use: No   Sexual activity: Yes    Partners: Female  Other Topics Concern   Not on file  Social History Narrative   Not on file   Social Determinants of Health   Financial Resource Strain: Not on file  Food Insecurity: Not on file  Transportation Needs: Not on file  Physical Activity: Not on file  Stress: Not on file  Social Connections: Not on file    Social: Former Chartered certified accountant  Family History: The patient's family history includes Prostate cancer in his father. History of coronary artery disease notable for M GF MI.  ROS:   Please see the history of present illness.     All other systems reviewed and are  negative.  EKGs/Labs/Other Studies Reviewed:    The following studies were reviewed today:  EKG:  EKG is  ordered today.  The ekg ordered today demonstrates   06/28/21 SR 94 Non specific TWI  CT 05/17/21 Aortic atherosclerosis and 3VD CAC  Recent Labs: 06/11/2021: ALT 8; BUN 12; Creatinine, Ser 0.87; Hemoglobin 14.9; Platelets 348.0; Potassium 4.1; Sodium 140  Recent Lipid Panel    Component Value Date/Time   CHOL 180 06/11/2021 1518   TRIG 126.0 06/11/2021 1518   HDL 46.40 06/11/2021 1518   CHOLHDL 4 06/11/2021 1518   VLDL 25.2 06/11/2021 1518   LDLCALC 109 (H) 06/11/2021 1518    Physical Exam:    VS:  BP (!) 145/85    Pulse 94    Ht 6' (1.829 m)    Wt 77.6 kg    SpO2 96%    BMI 23.19 kg/m     Wt Readings from Last 3 Encounters:  06/28/21 77.6 kg  06/11/21 78.5 kg  09/28/20 78.5 kg     GEN:  Well nourished, well developed in no acute distress HEENT: Normal NECK: No JVD; No carotid bruits LYMPHATICS: No lymphadenopathy CARDIAC: RRR, no murmurs, rubs, gallops RESPIRATORY:  Clear to auscultation without rales, wheezing or rhonchi ABDOMEN: Soft, non-tender, non-distended MUSCULOSKELETAL:  No edema; No deformity  SKIN: Warm and dry NEUROLOGIC:  Alert and oriented x 3 PSYCHIATRIC:  Normal affect   ASSESSMENT:    1. Dyspnea on exertion   2. Coronary atherosclerosis due to lipid rich plaque   3. COPD exacerbation (HCC)   4. Aortic atherosclerosis (HCC)   5. Essential hypertension   6. Cigarette smoker    PLAN:    DOE COPD and Tobacco Abuse CAC and Aortic atherosclerosis, AV calcium - starting on ASA 81 mg PO  - exercise NM Stress test - reviewed CT with patient  - will do ambulatory BP  - would increase amlodipine to 10 mg - discussed smoking cessation - if stress test is negative, OK for surgery (L shoulder)  Four months with me or APP  Medication Adjustments/Labs and Tests Ordered: Current medicines are reviewed at length with the patient today.   Concerns regarding medicines are outlined above.  Orders Placed This Encounter  Procedures   Cardiac Stress Test: Informed Consent Details: Physician/Practitioner Attestation; Transcribe to consent form and obtain patient signature   MYOCARDIAL PERFUSION IMAGING   EKG 12-Lead   Meds ordered this encounter  Medications   amLODipine (NORVASC) 10 MG tablet  Sig: Take 1 tablet (10 mg total) by mouth daily.    Dispense:  90 tablet    Refill:  3   aspirin EC 81 MG tablet    Sig: Take 1 tablet (81 mg total) by mouth daily. Swallow whole.    Dispense:  90 tablet    Refill:  3    Patient Instructions  Medication Instructions:  Your physician has recommended you make the following change in your medication:  INCREASE: amlodipine (Norvasc) to 10 mg by mouth once daily  *If you need a refill on your cardiac medications before your next appointment, please call your pharmacy*   Lab Work: NONE If you have labs (blood work) drawn today and your tests are completely normal, you will receive your results only by: MyChart Message (if you have MyChart) OR A paper copy in the mail If you have any lab test that is abnormal or we need to change your treatment, we will call you to review the results.   Testing/Procedures: Your physician has requested that you have en exercise stress myoview. For further information please visit [email or web address] Please follow instructions.  You are scheduled for a Myocardial Perfusion Imaging Study. Please arrive 15 minutes prior to your appointment time for registration and insurance purposes.   The test will take approximately 3 to 4 hours to complete; you may bring reading material.  If someone comes with you to your appointment, they will need to remain in the main lobby due to limited space in the testing area.    How to prepare for your Myocardial Perfusion Test: Do not eat or drink 3 hours prior to your test, except you may have water. Do not  consume products containing caffeine (regular or decaffeinated) 12 hours prior to your test. (ex: coffee, chocolate, sodas, tea). Do bring a list of your current medications with you.  If not listed below, you may take your medications as normal. Do wear comfortable clothes (no dresses or overalls) and walking shoes, tennis shoes preferred (No heels or open toe shoes are allowed). Do NOT wear cologne, perfume, aftershave, or lotions (deodorant is allowed). If these instructions are not followed, your test will have to be rescheduled.  If you cannot keep your appointment, please provide 24 hours notification to the Nuclear Lab, to avoid a possible $50 charge to your account.        Follow-Up: At [facility], you and your health needs are our priority.  As part of our continuing mission to provide you with exceptional heart care, we have created designated Provider Care Teams.  These Care Teams include your primary Cardiologist (physician) and Advanced Practice Providers (APPs -  Physician Assistants and Nurse Practitioners) who all work together to provide you with the care you need, when you need it.    Your next appointment:   4 month(s)  The format for your next appointment:   In Person  Provider:   Christell Constant, MD  or Ronie Spies, PA-C or Jacolyn Reedy, PA-C          Signed, Anthony Constant, MD  06/28/2021 3:12 PM    [facility]

## 2021-06-28 ENCOUNTER — Other Ambulatory Visit: Payer: Self-pay

## 2021-06-28 ENCOUNTER — Encounter: Payer: Self-pay | Admitting: Internal Medicine

## 2021-06-28 ENCOUNTER — Ambulatory Visit: Payer: Medicare Other | Admitting: Internal Medicine

## 2021-06-28 VITALS — BP 145/85 | HR 94 | Ht 72.0 in | Wt 171.0 lb

## 2021-06-28 DIAGNOSIS — I2510 Atherosclerotic heart disease of native coronary artery without angina pectoris: Secondary | ICD-10-CM

## 2021-06-28 DIAGNOSIS — I2583 Coronary atherosclerosis due to lipid rich plaque: Secondary | ICD-10-CM

## 2021-06-28 DIAGNOSIS — I10 Essential (primary) hypertension: Secondary | ICD-10-CM

## 2021-06-28 DIAGNOSIS — J441 Chronic obstructive pulmonary disease with (acute) exacerbation: Secondary | ICD-10-CM | POA: Diagnosis not present

## 2021-06-28 DIAGNOSIS — R0609 Other forms of dyspnea: Secondary | ICD-10-CM | POA: Diagnosis not present

## 2021-06-28 DIAGNOSIS — I700 Atherosclerosis of aorta: Secondary | ICD-10-CM

## 2021-06-28 DIAGNOSIS — F17210 Nicotine dependence, cigarettes, uncomplicated: Secondary | ICD-10-CM

## 2021-06-28 MED ORDER — AMLODIPINE BESYLATE 10 MG PO TABS: 10.0000 mg | ORAL_TABLET | Freq: Every day | ORAL | 3 refills | Status: DC

## 2021-06-28 MED ORDER — ASPIRIN EC 81 MG PO TBEC: 81.0000 mg | DELAYED_RELEASE_TABLET | Freq: Every day | ORAL | 3 refills | Status: AC

## 2021-06-28 NOTE — Patient Instructions (Signed)
Medication Instructions:  Your physician has recommended you make the following change in your medication:  INCREASE: amlodipine (Norvasc) to 10 mg by mouth once daily  *If you need a refill on your cardiac medications before your next appointment, please call your pharmacy*   Lab Work: NONE If you have labs (blood work) drawn today and your tests are completely normal, you will receive your results only by: MyChart Message (if you have MyChart) OR A paper copy in the mail If you have any lab test that is abnormal or we need to change your treatment, we will call you to review the results.   Testing/Procedures: Your physician has requested that you have en exercise stress myoview. For further information please visit [email or web address] Please follow instructions.  You are scheduled for a Myocardial Perfusion Imaging Study. Please arrive 15 minutes prior to your appointment time for registration and insurance purposes.   The test will take approximately 3 to 4 hours to complete; you may bring reading material.  If someone comes with you to your appointment, they will need to remain in the main lobby due to limited space in the testing area.    How to prepare for your Myocardial Perfusion Test: Do not eat or drink 3 hours prior to your test, except you may have water. Do not consume products containing caffeine (regular or decaffeinated) 12 hours prior to your test. (ex: coffee, chocolate, sodas, tea). Do bring a list of your current medications with you.  If not listed below, you may take your medications as normal. Do wear comfortable clothes (no dresses or overalls) and walking shoes, tennis shoes preferred (No heels or open toe shoes are allowed). Do NOT wear cologne, perfume, aftershave, or lotions (deodorant is allowed). If these instructions are not followed, your test will have to be rescheduled.  If you cannot keep your appointment, please provide 24 hours notification to the  Nuclear Lab, to avoid a possible $50 charge to your account.        Follow-Up: At [facility], you and your health needs are our priority.  As part of our continuing mission to provide you with exceptional heart care, we have created designated Provider Care Teams.  These Care Teams include your primary Cardiologist (physician) and Advanced Practice Providers (APPs -  Physician Assistants and Nurse Practitioners) who all work together to provide you with the care you need, when you need it.    Your next appointment:   4 month(s)  The format for your next appointment:   In Person  Provider:   Christell Constant, MD  or Ronie Spies, PA-C or Jacolyn Reedy, PA-C

## 2021-07-02 ENCOUNTER — Telehealth (HOSPITAL_COMMUNITY): Payer: Self-pay | Admitting: *Deleted

## 2021-07-02 ENCOUNTER — Encounter (HOSPITAL_COMMUNITY): Payer: Self-pay | Admitting: *Deleted

## 2021-07-02 NOTE — Telephone Encounter (Signed)
Left message on voicemail per DPR in reference to upcoming appointment scheduled on 07/09/21 at 8:00 with detailed instructions given per Myocardial Perfusion Study Information Sheet for the test. LM to arrive 15 minutes early, and that it is imperative to arrive on time for appointment to keep from having the test rescheduled. If you need to cancel or reschedule your appointment, please call the office within 24 hours of your appointment. Failure to do so may result in a cancellation of your appointment, and a $50 no show fee. Phone number given for call back for any questions.

## 2021-07-09 ENCOUNTER — Encounter (HOSPITAL_COMMUNITY): Payer: Medicare Other

## 2021-07-13 ENCOUNTER — Telehealth (HOSPITAL_COMMUNITY): Payer: Self-pay | Admitting: Internal Medicine

## 2021-07-13 NOTE — Telephone Encounter (Signed)
Patient cancelled Myoview and does not wish to reschedule at this time:  07/13/21 spoke with patient and he does not wish to reschedule at tis time-LBW 12:09  07/06/2021 1:11 PM VO:[id number] KUBAK, ELZBIETA  Cancel Rsn: Patient (Pt has gout flare-up)  Order will be removed from the [facility].

## 2021-09-05 ENCOUNTER — Other Ambulatory Visit: Payer: Self-pay

## 2021-09-05 MED ORDER — AMLODIPINE BESYLATE 10 MG PO TABS: 10.0000 mg | ORAL_TABLET | Freq: Every day | ORAL | 3 refills | Status: AC

## 2021-09-10 ENCOUNTER — Telehealth: Payer: Self-pay

## 2021-09-10 ENCOUNTER — Telehealth: Payer: Self-pay | Admitting: Internal Medicine

## 2021-09-10 DIAGNOSIS — I2510 Atherosclerotic heart disease of native coronary artery without angina pectoris: Secondary | ICD-10-CM

## 2021-09-10 DIAGNOSIS — I700 Atherosclerosis of aorta: Secondary | ICD-10-CM

## 2021-09-10 MED ORDER — ROSUVASTATIN CALCIUM 20 MG PO TABS: 20.0000 mg | ORAL_TABLET | Freq: Every day | ORAL | 3 refills | Status: AC

## 2021-09-10 NOTE — Telephone Encounter (Signed)
amLODipine (NORVASC) 10 MG tablet ?Medication ?Date: 09/05/2021 Department: [facility] [address] Office Ordering/Authorizing: Christell Constant, MD  ? ?Order Providers ? ?Prescribing Provider Encounter Provider  ?Christell Constant, MD Margaret Pyle D, CMA  ? ?Outpatient Medication Detail ? ? Disp Refills Start End   ?amLODipine (NORVASC) 10 MG tablet 90 tablet 3 09/05/2021    ?Sig - Route: Take 1 tablet (10 mg total) by mouth daily. - Oral   ?Sent to pharmacy as: amLODipine (NORVASC) 10 MG tablet   ?E-Prescribing Status: Receipt confirmed by pharmacy (09/05/2021  4:03 PM EDT)   ? ?Pharmacy ? ?[facility] - [place], [place] - [address]  ? ?Additional Information ? ?Associated Reports  ?View Encounter  ?Priority and Order Details  ? ?

## 2021-09-10 NOTE — Telephone Encounter (Signed)
Medication rosuvastatin sent to new pharmacy on file  ?

## 2021-09-10 NOTE — Telephone Encounter (Signed)
?*  STAT* If patient is at the pharmacy, call can be transferred to refill team.   1. Which medications need to be refilled? (please list name of each medication and dose if known)   amLODipine (NORVASC) 10 MG tablet    2. Which pharmacy/location (including street and city if local pharmacy) is medication to be sent to? [facility] - [place], [place] - [address]  3. Do they need a 30 day or 90 day supply?  90 day  

## 2021-09-10 NOTE — Telephone Encounter (Signed)
Pt wife is calling to request a new Rx to be sent to a new pharmacy. ? ?Pt is requesting: ?rosuvastatin (CRESTOR) 20 MG tablet ? ?Pharmacy: ?[facility] - [place], [place] - [address] ? ?Pt currently has 3 refills remaining at his local [facility] pharmacy. ? ?Please advise ?

## 2021-10-24 ENCOUNTER — Encounter: Payer: Self-pay | Admitting: Internal Medicine

## 2021-10-24 ENCOUNTER — Other Ambulatory Visit (HOSPITAL_COMMUNITY): Payer: Self-pay

## 2021-10-24 ENCOUNTER — Ambulatory Visit: Payer: Medicare Other | Admitting: Internal Medicine

## 2021-10-24 VITALS — BP 128/58 | HR 101 | Ht 72.0 in | Wt 170.0 lb

## 2021-10-24 DIAGNOSIS — J441 Chronic obstructive pulmonary disease with (acute) exacerbation: Secondary | ICD-10-CM

## 2021-10-24 DIAGNOSIS — R0609 Other forms of dyspnea: Secondary | ICD-10-CM

## 2021-10-24 DIAGNOSIS — F17210 Nicotine dependence, cigarettes, uncomplicated: Secondary | ICD-10-CM

## 2021-10-24 DIAGNOSIS — I700 Atherosclerosis of aorta: Secondary | ICD-10-CM

## 2021-10-24 DIAGNOSIS — I2583 Coronary atherosclerosis due to lipid rich plaque: Secondary | ICD-10-CM

## 2021-10-24 DIAGNOSIS — R072 Precordial pain: Secondary | ICD-10-CM

## 2021-10-24 DIAGNOSIS — I2510 Atherosclerotic heart disease of native coronary artery without angina pectoris: Secondary | ICD-10-CM | POA: Diagnosis not present

## 2021-10-24 DIAGNOSIS — I10 Essential (primary) hypertension: Secondary | ICD-10-CM

## 2021-10-24 MED ORDER — METOPROLOL TARTRATE 100 MG PO TABS
100.0000 mg | ORAL_TABLET | Freq: Once | ORAL | 0 refills | Status: AC
  Filled 2021-10-24 – 2021-11-09 (×2): qty 1, 1d supply, fill #0

## 2021-10-24 MED ORDER — IVABRADINE HCL 5 MG PO TABS
15.0000 mg | ORAL_TABLET | Freq: Once | ORAL | 0 refills | Status: AC
  Filled 2021-10-24 – 2021-11-09 (×2): qty 3, 1d supply, fill #0

## 2021-10-24 NOTE — Progress Notes (Signed)
Cardiology Office Note:    Date:  10/24/2021   ID:  Anthony Mayo, DOB 18-Sep-1955, MRN [id number]  PCP:  Georgina Quint, MD   [facility] Providers Cardiologist:  Christell Constant, MD     Referring MD: Georgina Quint, *   CC: CAC f/u  History of Present Illness:    Anthony Mayo is a 66 y.o. male with a hx of COPD and Asthma, smoker, CAC and aortic atherosclerosis, HTN who presents for evaluation. Did not get stress test at last visit. Due to gout flare.  Patient notes that he is doing ok.   Since last visit notes that he is getting in the yard, but is not as active as he used to. Can ride mow, and weed eat. There are no interval hospital/ED visit.    No chest pain or pressure.  No SOB but has fatigue and DOE and no PND/Orthopnea.  No weight gain or leg swelling.  No palpitations or syncope.    Past Medical History:  Diagnosis Date   Asthma    COPD (chronic obstructive pulmonary disease) (HCC)    Gout     Past Surgical History:  Procedure Laterality Date   REPLACEMENT TOTAL KNEE  12/11/2010    Current Medications: Current Meds  Medication Sig   allopurinol (ZYLOPRIM) 100 MG tablet Take 1 tablet (100 mg total) by mouth daily.   ivabradine (CORLANOR) 5 MG TABS tablet Take 3 tablets (15 mg total) by mouth once for 1 dose. Take 90-120 minutes prior to scan.   metoprolol tartrate (LOPRESSOR) 100 MG tablet Take 1 tablet (100 mg total) by mouth once for 1 dose. Take 90-120 minutes prior to scan.     Allergies:   Patient has no known allergies.   Social History   Socioeconomic History   Marital status: Married    Spouse name: Not on file   Number of children: 0   Years of education: Not on file   Highest education level: Not on file  Occupational History   Not on file  Tobacco Use   Smoking status: Every Day    Packs/day: 1.00    Years: 47.00    Pack years: 47.00    Types: Cigarettes   Smokeless tobacco: Current    Types: Snuff  Vaping Use    Vaping Use: Never used  Substance and Sexual Activity   Alcohol use: Yes    Alcohol/week: 24.0 standard drinks    Types: 24 Cans of beer per week   Drug use: No   Sexual activity: Yes    Partners: Female  Other Topics Concern   Not on file  Social History Narrative   Not on file   Social Determinants of Health   Financial Resource Strain: Not on file  Food Insecurity: Not on file  Transportation Needs: Not on file  Physical Activity: Not on file  Stress: Not on file  Social Connections: Not on file    Social: Former Chartered certified accountant  Family History: The patient's family history includes Prostate cancer in his father. History of coronary artery disease notable for M GF MI.  ROS:   Please see the history of present illness.     All other systems reviewed and are negative.  EKGs/Labs/Other Studies Reviewed:    The following studies were reviewed today:  EKG:  EKG is  ordered today.  The ekg ordered today demonstrates   06/28/21 SR 94 Non specific TWI  CT 05/17/21 Aortic atherosclerosis and 3VD CAC  Recent Labs: 06/11/2021: ALT 8; BUN 12; Creatinine, Ser 0.87; Hemoglobin 14.9; Platelets 348.0; Potassium 4.1; Sodium 140  Recent Lipid Panel    Component Value Date/Time   CHOL 180 06/11/2021 1518   TRIG 126.0 06/11/2021 1518   HDL 46.40 06/11/2021 1518   CHOLHDL 4 06/11/2021 1518   VLDL 25.2 06/11/2021 1518   LDLCALC 109 (H) 06/11/2021 1518    Physical Exam:    VS:  BP (!) 128/58   Pulse (!) 101   Ht 6' (1.829 m)   Wt 170 lb (77.1 kg)   SpO2 94%   BMI 23.06 kg/m     Wt Readings from Last 3 Encounters:  10/24/21 170 lb (77.1 kg)  06/28/21 171 lb (77.6 kg)  06/11/21 173 lb (78.5 kg)     GEN:  Well nourished, smells of smoke HEENT: Normal NECK: No JVD; No carotid bruits LYMPHATICS: No lymphadenopathy CARDIAC: regular tachycardia, no murmurs, rubs, gallops RESPIRATORY:  bilateral inspiratory wheeze ABDOMEN: Soft, non-tender, non-distended MUSCULOSKELETAL:  No  edema; No deformity  SKIN: Warm and dry NEUROLOGIC:  Alert and oriented x 3 PSYCHIATRIC:  Normal affect   ASSESSMENT:    1. Dyspnea on exertion   2. COPD exacerbation (HCC)   3. Aortic atherosclerosis (HCC)   4. Coronary atherosclerosis due to lipid rich plaque   5. Essential hypertension   6. Cigarette smoker   7. Precordial pain     PLAN:    DOE Precordial discomfort COPD and Tobacco Abuse CAC and Aortic atherosclerosis, AV calcium - continue ASA 81 mg PO daily - Will get cardiac CT (wanted to do PET MPI because of his CAC but cannot because of his active wheezing) - post CT, may change amlodipine to diltiazem post CT - at next blood draw will check fasting lipids, likely increase rosuvastatin to 20 mg (he is only taking 10) - three months f/u; will see sooner if obstructive disease, if non obstructive give DOE would get PFTs - we discussed smoking cessation . Fall f/u   Medication Adjustments/Labs and Tests Ordered: Current medicines are reviewed at length with the patient today.  Concerns regarding medicines are outlined above.  Orders Placed This Encounter  Procedures   CT CORONARY MORPH W/CTA COR W/SCORE W/CA W/CM &/OR WO/CM   Basic Metabolic Panel (BMET)   Meds ordered this encounter  Medications   ivabradine (CORLANOR) 5 MG TABS tablet    Sig: Take 3 tablets (15 mg total) by mouth once for 1 dose. Take 90-120 minutes prior to scan.    Dispense:  3 tablet    Refill:  0   metoprolol tartrate (LOPRESSOR) 100 MG tablet    Sig: Take 1 tablet (100 mg total) by mouth once for 1 dose. Take 90-120 minutes prior to scan.    Dispense:  1 tablet    Refill:  0    Patient Instructions  Medication Instructions:   *If you need a refill on your cardiac medications before your next appointment, please call your pharmacy*   Lab Work: BMET FOR CT  If you have labs (blood work) drawn today and your tests are completely normal, you will receive your results only  by: MyChart Message (if you have MyChart) OR A paper copy in the mail If you have any lab test that is abnormal or we need to change your treatment, we will call you to review the results.   Testing/Procedures:   Your cardiac CT will be scheduled at one of the below locations:  [facility] [address] [place], [place] [address] [phone]  OR  [facility] [address] [place], [place] [address] [phone]  If scheduled at [facility], please arrive at the [facility] (Entrance C2) of [facility] 30 minutes prior to test start time. You can use the FREE valet parking offered at entrance C (encouraged to control the heart rate for the test)  Proceed to the [facility] Radiology Department (first floor) to check-in and test prep.  All radiology patients and guests should use entrance C2 at [facility], accessed from [facility], even though the hospital's physical address listed is [address]    If scheduled at [facility], please arrive 15 mins early for check-in and test prep.  Please follow these instructions carefully (unless otherwise directed):  Hold all erectile dysfunction medications at least 3 days (72 hrs) prior to test.  On the Night Before the Test: Be sure to Drink plenty of water. Do not consume any caffeinated/decaffeinated beverages or chocolate 12 hours prior to your test. Do not take any antihistamines 12 hours prior to your test.  On the Day of the Test: Drink plenty of water until 1 hour prior to the test. Do not eat any food 4 hours prior to the test. You may take your regular medications prior to the test  Take metoprolol (Lopressor) and Ivabradine two hours prior to test sent to [facility]        After the Test: Drink plenty of water. After receiving IV contrast,  you may experience a mild flushed feeling. This is normal. On occasion, you may experience a mild rash up to 24 hours after the test. This is not dangerous. If this occurs, you can take Benadryl 25 mg and increase your fluid intake. If you experience trouble breathing, this can be serious. If it is severe call 911 IMMEDIATELY. If it is mild, please call our office.  We will call to schedule your test 2-4 weeks out understanding that some insurance companies will need an authorization prior to the service being performed.   For non-scheduling related questions, please contact the cardiac imaging nurse navigator should you have any questions/concerns: Rockwell AlexandriaSara Wallace, Cardiac Imaging Nurse Navigator Larey BrickMerle Prescott, Cardiac Imaging Nurse Navigator [facility] Direct Office Dial: [phone]   For scheduling needs, including cancellations and rescheduling, please call [place], [phone].    Follow-Up: At [facility], you and your health needs are our priority.  As part of our continuing mission to provide you with exceptional heart care, we have created designated Provider Care Teams.  These Care Teams include your primary Cardiologist (physician) and Advanced Practice Providers (APPs -  Physician Assistants and Nurse Practitioners) who all work together to provide you with the care you need, when you need it.  We recommend signing up for the patient portal called "MyChart".  Sign up information is provided on this After Visit Summary.  MyChart is used to connect with patients for Virtual Visits (Telemedicine).  Patients are able to view lab/test results, encounter notes, upcoming appointments, etc.  Non-urgent messages can be sent to your provider as well.   To learn more about what you can do with MyChart, go to [email or web address].    Your next appointment:   4 month(s)  The format for your next appointment:   In Person  Provider:   [facility]  Richardean Chimera, MD     Other Instructions   Important Information About Sugar         Signed, Christell Constant, MD  10/24/2021 4:47 PM    [facility]

## 2021-10-24 NOTE — Patient Instructions (Addendum)
Medication Instructions:   *If you need a refill on your cardiac medications before your next appointment, please call your pharmacy*   Lab Work: BMET FOR CT  If you have labs (blood work) drawn today and your tests are completely normal, you will receive your results only by: MyChart Message (if you have MyChart) OR A paper copy in the mail If you have any lab test that is abnormal or we need to change your treatment, we will call you to review the results.   Testing/Procedures:   Your cardiac CT will be scheduled at one of the below locations:   [facility] [address] [place], [place] [address] [phone]  OR  [facility] [address] [place], [place] [address] [phone]  If scheduled at [facility], please arrive at the [facility] and Children's Entrance (Entrance C2) of [facility] 30 minutes prior to test start time. You can use the FREE valet parking offered at entrance C (encouraged to control the heart rate for the test)  Proceed to the [facility] Radiology Department (first floor) to check-in and test prep.  All radiology patients and guests should use entrance C2 at [facility], accessed from [facility], even though the hospital's physical address listed is [address]    If scheduled at [facility], please arrive 15 mins early for check-in and test prep.  Please follow these instructions carefully (unless otherwise directed):  Hold all erectile dysfunction medications at least 3 days (72 hrs) prior to test.  On the Night Before the Test: Be sure to Drink plenty of water. Do not consume any caffeinated/decaffeinated beverages or chocolate 12 hours prior to your test. Do not take any antihistamines 12 hours prior to your test.  On the Day of the Test: Drink plenty of water until 1 hour prior to the test. Do not eat  any food 4 hours prior to the test. You may take your regular medications prior to the test  Take metoprolol (Lopressor) and Ivabradine two hours prior to test sent to [facility]        After the Test: Drink plenty of water. After receiving IV contrast, you may experience a mild flushed feeling. This is normal. On occasion, you may experience a mild rash up to 24 hours after the test. This is not dangerous. If this occurs, you can take Benadryl 25 mg and increase your fluid intake. If you experience trouble breathing, this can be serious. If it is severe call 911 IMMEDIATELY. If it is mild, please call our office.  We will call to schedule your test 2-4 weeks out understanding that some insurance companies will need an authorization prior to the service being performed.   For non-scheduling related questions, please contact the cardiac imaging nurse navigator should you have any questions/concerns: Rockwell Alexandria, Cardiac Imaging Nurse Navigator Larey Brick, Cardiac Imaging Nurse Navigator [facility] Direct Office Dial: [phone]   For scheduling needs, including cancellations and rescheduling, please call [place], [phone].    Follow-Up: At [facility], you and your health needs are our priority.  As part of our continuing mission to provide you with exceptional heart care, we have created designated Provider Care Teams.  These Care Teams include your primary Cardiologist (physician) and Advanced Practice Providers (APPs -  Physician Assistants and Nurse Practitioners) who all work together to provide you with the  care you need, when you need it.  We recommend signing up for the patient portal called "MyChart".  Sign up information is provided on this After Visit Summary.  MyChart is used to connect with patients for Virtual Visits (Telemedicine).  Patients are able to view lab/test results, encounter notes, upcoming  appointments, etc.  Non-urgent messages can be sent to your provider as well.   To learn more about what you can do with MyChart, go to [email or web address].    Your next appointment:   4 month(s)  The format for your next appointment:   In Person  Provider:   Christell Constant, MD     Other Instructions   Important Information About Sugar

## 2021-10-25 ENCOUNTER — Telehealth: Payer: Self-pay | Admitting: Internal Medicine

## 2021-10-25 NOTE — Telephone Encounter (Signed)
*  STAT* If patient is at the pharmacy, call can be transferred to refill team.   1. Which medications need to be refilled? (please list name of each medication and dose if known)   amLODipine (NORVASC) 10 MG tablet    2. Which pharmacy/location (including street and city if local pharmacy) is medication to be sent to? [facility] - [place], [place] - [address]  3. Do they need a 30 day or 90 day supply?  90 day

## 2021-10-25 NOTE — Telephone Encounter (Signed)
*  STAT* If patient is at the pharmacy, call can be transferred to refill team.   1. Which medications need to be refilled? (please list name of each medication and dose if known) ivabradine (CORLANOR) 5 MG TABS tablet  metoprolol tartrate (LOPRESSOR) 100 MG tablet   2. Which pharmacy/location (including street and city if local pharmacy) is medication to be sent to? [facility]  3. Do they need a 30 day or 90 day supply?

## 2021-10-30 ENCOUNTER — Other Ambulatory Visit (HOSPITAL_COMMUNITY): Payer: Self-pay

## 2021-11-03 ENCOUNTER — Other Ambulatory Visit (HOSPITAL_COMMUNITY): Payer: Self-pay

## 2021-11-09 ENCOUNTER — Other Ambulatory Visit (HOSPITAL_COMMUNITY): Payer: Self-pay

## 2021-11-13 ENCOUNTER — Telehealth (HOSPITAL_COMMUNITY): Payer: Self-pay | Admitting: Emergency Medicine

## 2021-11-14 ENCOUNTER — Ambulatory Visit (HOSPITAL_BASED_OUTPATIENT_CLINIC_OR_DEPARTMENT_OTHER)
Admission: RE | Admit: 2021-11-14 | Discharge: 2021-11-14 | Disposition: A | Discharge status: Home / Self Care | Payer: Medicare Other | Source: Ambulatory Visit | Attending: Internal Medicine | Admitting: Internal Medicine

## 2021-11-14 ENCOUNTER — Ambulatory Visit (HOSPITAL_COMMUNITY)
Admission: RE | Admit: 2021-11-14 | Discharge: 2021-11-14 | Disposition: A | Discharge status: Home / Self Care | Payer: Medicare Other | Source: Ambulatory Visit | Attending: Internal Medicine | Admitting: Internal Medicine

## 2021-11-14 ENCOUNTER — Telehealth: Payer: Self-pay

## 2021-11-14 ENCOUNTER — Other Ambulatory Visit: Payer: Self-pay | Admitting: Internal Medicine

## 2021-11-14 DIAGNOSIS — I2510 Atherosclerotic heart disease of native coronary artery without angina pectoris: Secondary | ICD-10-CM

## 2021-11-14 DIAGNOSIS — R072 Precordial pain: Secondary | ICD-10-CM | POA: Insufficient documentation

## 2021-11-14 DIAGNOSIS — R931 Abnormal findings on diagnostic imaging of heart and coronary circulation: Secondary | ICD-10-CM | POA: Diagnosis not present

## 2021-11-14 MED ORDER — NITROGLYCERIN 0.4 MG SL SUBL
0.8000 mg | SUBLINGUAL_TABLET | Freq: Once | SUBLINGUAL | Status: AC
  Administered 2021-11-14: 0.8 mg via SUBLINGUAL

## 2021-11-14 MED ORDER — DILTIAZEM HCL ER COATED BEADS 120 MG PO CP24: 120.0000 mg | ORAL_CAPSULE | Freq: Every day | ORAL | 3 refills | Status: AC

## 2021-11-14 MED ORDER — NITROGLYCERIN 0.4 MG SL SUBL
SUBLINGUAL_TABLET | SUBLINGUAL | Status: AC
  Filled 2021-11-14: qty 2

## 2021-11-14 MED ORDER — IOHEXOL 350 MG/ML SOLN
100.0000 mL | Freq: Once | INTRAVENOUS | Status: AC | PRN
  Administered 2021-11-14: 100 mL via INTRAVENOUS

## 2021-11-14 NOTE — Telephone Encounter (Signed)
Outreach made to Pt.  Pt advised of CT results and physician orders.  [facility]em [facility]  Pt does not request follow up appointment at this time.  Will forward to physician to determine when Pt should have scheduled follow up.

## 2021-11-14 NOTE — Telephone Encounter (Signed)
-----   Message from Christell Constant, MD sent at 11/14/2021  3:23 PM EDT ----- Results: Severe obstruction with normal blood flow (assessed via FFR, separate report) Plan: Smoking cessation  Diltiazem 120 mg XL If DOE is worse, have him take my 6/29 8:30 AM appt  Christell Constant, MD

## 2021-11-15 NOTE — Telephone Encounter (Signed)
Called pt to inform has an OV with Dr. Izora Ribas scheduled for 02/20/22 at 10 am. Also reviewed Cardiac CT results with pt per request.  Reviewed cardiac meds with pt and why meds were prescribed.  Advised pt to monitor BP and HR and call in if is consistently below 110 SBP and lightheaded or dizzy.  Advised to call back with any additional questions or concern.

## 2021-12-17 ENCOUNTER — Telehealth: Payer: Self-pay | Admitting: Internal Medicine

## 2021-12-17 MED ORDER — TRELEGY ELLIPTA 100-62.5-25 MCG/ACT IN AEPB: 1.0000 | INHALATION_SPRAY | Freq: Every day | RESPIRATORY_TRACT | 0 refills | Status: AC

## 2021-12-17 NOTE — Telephone Encounter (Signed)
Send to pharmacy [facility] to do search for cheapest alternative to Trelegy

## 2021-12-17 NOTE — Telephone Encounter (Signed)
Called and spoke with patient and patients wife Anthony Mayo. Anthony Mayo wants to know if they have other options with being in the donut hole with the trelegy. Advised Anthony Mayo I would leave samples at the front desk for patient.   MW, please advise.

## 2021-12-18 ENCOUNTER — Other Ambulatory Visit (HOSPITAL_COMMUNITY): Payer: Self-pay

## 2021-12-18 NOTE — Telephone Encounter (Signed)
Breo 100  and incruse combined are the same med as trelegy

## 2021-12-20 ENCOUNTER — Other Ambulatory Visit (HOSPITAL_COMMUNITY): Payer: Self-pay

## 2022-02-19 NOTE — Progress Notes (Deleted)
Cardiology Office Note:    Date:  02/19/2022   ID:  Anthony Mayo, DOB 1955-11-14, MRN [id number]  PCP:  Georgina Quint, MD   [facility] Providers Cardiologist:  Christell Constant, MD     Referring MD: Georgina Quint, *   CC: CAC f/u  History of Present Illness:    Anthony Mayo is a 66 y.o. male with a hx of COPD and Asthma, smoker, CAC and aortic atherosclerosis, HTN who presents for evaluation. Did not get stress test at last visit. Due to gout flare. 2023: Sx improved at last visit, CCTA performed. 3VD worse in the RCA, FFR negative.  Asymptomatic when called. Started on diltiazem.  Patient notes that he is doing ***.   Since last visit notes *** . There are no*** interval hospital/ED visit.    No chest pain or pressure ***.  No SOB/DOE*** and no PND/Orthopnea***.  No weight gain or leg swelling***.  No palpitations or syncope ***.  Ambulatory blood pressure ***.   Past Medical History:  Diagnosis Date   Asthma    COPD (chronic obstructive pulmonary disease) (HCC)    Gout     Past Surgical History:  Procedure Laterality Date   REPLACEMENT TOTAL KNEE  12/11/2010    Current Medications: No outpatient medications have been marked as taking for the 02/20/22 encounter (Appointment) with Christell Constant, MD.     Allergies:   Patient has no known allergies.   Social History   Socioeconomic History   Marital status: Married    Spouse name: Not on file   Number of children: 0   Years of education: Not on file   Highest education level: Not on file  Occupational History   Not on file  Tobacco Use   Smoking status: Every Day    Packs/day: 1.00    Years: 47.00    Total pack years: 47.00    Types: Cigarettes   Smokeless tobacco: Current    Types: Snuff  Vaping Use   Vaping Use: Never used  Substance and Sexual Activity   Alcohol use: Yes    Alcohol/week: 24.0 standard drinks of alcohol    Types: 24 Cans of beer per week   Drug  use: No   Sexual activity: Yes    Partners: Female  Other Topics Concern   Not on file  Social History Narrative   Not on file   Social Determinants of Health   Financial Resource Strain: Not on file  Food Insecurity: Not on file  Transportation Needs: No Transportation Needs (12/11/2018)   PRAPARE - Administrator, Civil Service (Medical): No    Lack of Transportation (Non-Medical): No  Physical Activity: Not on file  Stress: Not on file  Social Connections: Not on file    Social: Former Chartered certified accountant  Family History: The patient's family history includes Prostate cancer in his father. History of coronary artery disease notable for M GF MI.  ROS:   Please see the history of present illness.     All other systems reviewed and are negative.  EKGs/Labs/Other Studies Reviewed:    The following studies were reviewed today:  EKG:  EKG is  ordered today.  The ekg ordered today demonstrates  02/20/22: *** 06/28/21 SR 94 Non specific TWI  CT CORONARY FRACTIONAL FLOW RESERVE DATA PREP 11/14/2021 CT CORONARY FRACTIONAL FLOW RESERVE FLUID ANALYSIS 11/14/2021  Narrative EXAM: CT FFR ANALYSIS  CLINICAL DATA:  abnormal coronary CT  FINDINGS: FFRct analysis  was performed on the original cardiac CT angiogram dataset. Diagrammatic representation of the FFRct analysis is provided in a separate PDF document in PACS. This dictation was created using the PDF document and an interactive 3D model of the results. 3D model is not available in the EMR/PACS. Normal FFR range is >0.80. Indeterminate (grey) zone is 0.76-0.80. FFR delta of 0.13 is considered significant.  1. Left Main: FFR = 0.98  2. LAD: Proximal FFR = 0.96, mid FFR = 0.89, Distal FFR = 0.82 3. LCX: Proximal FFR = 0.95, OM1 FFR = 0.81 4. RCA: Proximal FFR = 0.97, mid FFR =0.85, Distal FFR = 0.80  IMPRESSION: 1.  CT FFR analysis showed no significant stenosis.  RECOMMENDATIONS: Guideline-directed medical  therapy and aggressive risk factor modification for secondary prevention of coronary artery disease.   Electronically Signed By: Cherlynn Kaiser M.D. On: 11/14/2021 15:08     Recent Labs: 06/11/2021: ALT 8; BUN 12; Creatinine, Ser 0.87; Hemoglobin 14.9; Platelets 348.0; Potassium 4.1; Sodium 140  Recent Lipid Panel    Component Value Date/Time   CHOL 180 06/11/2021 1518   TRIG 126.0 06/11/2021 1518   HDL 46.40 06/11/2021 1518   CHOLHDL 4 06/11/2021 1518   VLDL 25.2 06/11/2021 1518   LDLCALC 109 (H) 06/11/2021 1518    Physical Exam:    VS:  There were no vitals taken for this visit.    Wt Readings from Last 3 Encounters:  10/24/21 170 lb (77.1 kg)  06/28/21 171 lb (77.6 kg)  06/11/21 173 lb (78.5 kg)    Gen: *** distress, *** obese/well nourished/malnourished   Neck: No JVD, *** carotid bruit Ears: *** Frank Sign Cardiac: No Rubs or Gallops, *** Murmur, ***cardia, *** radial pulses Respiratory: Clear to auscultation bilaterally, *** effort, ***  respiratory rate GI: Soft, nontender, non-distended *** MS: No *** edema; *** moves all extremities Integument: Skin feels *** Neuro:  At time of evaluation, alert and oriented to person/place/time/situation *** Psych: Normal affect, patient feels ***   ASSESSMENT:    No diagnosis found.  PLAN:    Obstructive CAD COPD and Tobacco Abuse Aortic atherosclerosis, AV calcium - continue ASA 81 mg PO daily - at next blood draw will check fasting lipids, likely increase rosuvastatin to 20 mg (he is only taking 10)   Medication Adjustments/Labs and Tests Ordered: Current medicines are reviewed at length with the patient today.  Concerns regarding medicines are outlined above.  No orders of the defined types were placed in this encounter.  No orders of the defined types were placed in this encounter.   There are no Patient Instructions on file for this visit.   Signed, Werner Lean, MD  02/19/2022 1:15 PM     [facility]

## 2022-02-20 ENCOUNTER — Ambulatory Visit: Payer: Medicare Other | Attending: Internal Medicine | Admitting: Internal Medicine

## 2022-02-21 DIAGNOSIS — H524 Presbyopia: Secondary | ICD-10-CM | POA: Diagnosis not present

## 2022-04-26 ENCOUNTER — Other Ambulatory Visit (HOSPITAL_COMMUNITY): Payer: Self-pay

## 2022-05-15 ENCOUNTER — Encounter: Payer: Self-pay | Admitting: Internal Medicine

## 2022-05-17 ENCOUNTER — Other Ambulatory Visit: Payer: Medicare Other

## 2022-08-12 ENCOUNTER — Other Ambulatory Visit: Payer: Self-pay | Admitting: Internal Medicine

## 2023-02-21 ENCOUNTER — Other Ambulatory Visit: Payer: Self-pay | Admitting: Emergency Medicine

## 2023-02-21 DIAGNOSIS — Z1211 Encounter for screening for malignant neoplasm of colon: Secondary | ICD-10-CM

## 2023-02-21 DIAGNOSIS — Z1212 Encounter for screening for malignant neoplasm of rectum: Secondary | ICD-10-CM

## 2023-06-03 ENCOUNTER — Encounter: Payer: Self-pay | Admitting: Acute Care

## 2023-07-02 ENCOUNTER — Ambulatory Visit: Payer: Medicare PPO

## 2023-07-02 ENCOUNTER — Encounter: Payer: Medicare PPO | Admitting: Emergency Medicine
